# Patient Record
Sex: Male | Born: 1990 | Race: White | Hispanic: No | Marital: Married | Smoking: Current some day smoker
Health system: Southern US, Community
[De-identification: ages and names within clinical notes are randomized; demographics above are authoritative.]

## PROBLEM LIST (undated history)

## (undated) DIAGNOSIS — R748 Abnormal levels of other serum enzymes: Secondary | ICD-10-CM

## (undated) DIAGNOSIS — J45909 Unspecified asthma, uncomplicated: Secondary | ICD-10-CM

## (undated) DIAGNOSIS — I10 Essential (primary) hypertension: Secondary | ICD-10-CM

## (undated) HISTORY — DX: Hypercalcemia: E83.52

## (undated) HISTORY — DX: Unspecified asthma, uncomplicated: J45.909

## (undated) HISTORY — DX: Essential (primary) hypertension: I10

## (undated) HISTORY — DX: Abnormal levels of other serum enzymes: R74.8

---

## 2018-03-12 ENCOUNTER — Ambulatory Visit: Payer: BLUE CROSS/BLUE SHIELD | Admitting: Family Medicine

## 2018-03-12 ENCOUNTER — Encounter: Payer: Self-pay | Admitting: Family Medicine

## 2018-03-12 ENCOUNTER — Ambulatory Visit
Admission: RE | Admit: 2018-03-12 | Discharge: 2018-03-12 | Disposition: A | Discharge status: Home / Self Care | Payer: BLUE CROSS/BLUE SHIELD | Source: Ambulatory Visit | Attending: Family Medicine | Admitting: Family Medicine

## 2018-03-12 VITALS — BP 146/100 | HR 77 | Temp 98.3°F | Resp 16 | Ht 72.0 in | Wt 242.4 lb

## 2018-03-12 DIAGNOSIS — Z23 Encounter for immunization: Secondary | ICD-10-CM

## 2018-03-12 DIAGNOSIS — R030 Elevated blood-pressure reading, without diagnosis of hypertension: Secondary | ICD-10-CM | POA: Diagnosis not present

## 2018-03-12 DIAGNOSIS — R062 Wheezing: Secondary | ICD-10-CM

## 2018-03-12 DIAGNOSIS — Z7689 Persons encountering health services in other specified circumstances: Secondary | ICD-10-CM

## 2018-03-12 DIAGNOSIS — F17200 Nicotine dependence, unspecified, uncomplicated: Secondary | ICD-10-CM

## 2018-03-12 DIAGNOSIS — J4530 Mild persistent asthma, uncomplicated: Secondary | ICD-10-CM | POA: Diagnosis not present

## 2018-03-12 DIAGNOSIS — R05 Cough: Secondary | ICD-10-CM | POA: Diagnosis not present

## 2018-03-12 MED ORDER — BUDESONIDE-FORMOTEROL FUMARATE 160-4.5 MCG/ACT IN AERO: 2.0000 | INHALATION_SPRAY | Freq: Two times a day (BID) | RESPIRATORY_TRACT | 1 refills | Status: DC

## 2018-03-12 MED ORDER — ALBUTEROL SULFATE HFA 108 (90 BASE) MCG/ACT IN AERS: 2.0000 | INHALATION_SPRAY | Freq: Four times a day (QID) | RESPIRATORY_TRACT | 1 refills | Status: DC | PRN

## 2018-03-12 NOTE — Patient Instructions (Addendum)
Start using the Symbicort inhaler 2 puffs twice daily.  This is to help get her asthma under better control. Make sure you rinse your mouth after each use. Use the albuterol inhaler as needed.  I recommend that you stop smoking.  Your blood pressure is elevated today. Goal BP reading is <130/80.  Purchase a blood pressure cuff and start checking your blood pressure outside of here. Bring in your blood pressure machine and readings in 2 to 4 weeks. Your visit will be to follow up on asthma, blood pressure as well as a fasting physical exam.      DASH Eating Plan DASH stands for "Dietary Approaches to Stop Hypertension." The DASH eating plan is a healthy eating plan that has been shown to reduce high blood pressure (hypertension). It may also reduce your risk for type 2 diabetes, heart disease, and stroke. The DASH eating plan may also help with weight loss. What are tips for following this plan?  General guidelines  Avoid eating more than 2,300 mg (milligrams) of salt (sodium) a day. If you have hypertension, you may need to reduce your sodium intake to 1,500 mg a day.  Limit alcohol intake to no more than 1 drink a day for nonpregnant women and 2 drinks a day for men. One drink equals 12 oz of beer, 5 oz of wine, or 1 oz of hard liquor.  Work with your health care provider to maintain a healthy body weight or to lose weight. Ask what an ideal weight is for you.  Get at least 30 minutes of exercise that causes your heart to beat faster (aerobic exercise) most days of the week. Activities may include walking, swimming, or biking.  Work with your health care provider or diet and nutrition specialist (dietitian) to adjust your eating plan to your individual calorie needs. Reading food labels   Check food labels for the amount of sodium per serving. Choose foods with less than 5 percent of the Daily Value of sodium. Generally, foods with less than 300 mg of sodium per serving fit into  this eating plan.  To find whole grains, look for the word "whole" as the first word in the ingredient list. Shopping  Buy products labeled as "low-sodium" or "no salt added."  Buy fresh foods. Avoid canned foods and premade or frozen meals. Cooking  Avoid adding salt when cooking. Use salt-free seasonings or herbs instead of table salt or sea salt. Check with your health care provider or pharmacist before using salt substitutes.  Do not fry foods. Cook foods using healthy methods such as baking, boiling, grilling, and broiling instead.  Cook with heart-healthy oils, such as olive, canola, soybean, or sunflower oil. Meal planning  Eat a balanced diet that includes: ? 5 or more servings of fruits and vegetables each day. At each meal, try to fill half of your plate with fruits and vegetables. ? Up to 6-8 servings of whole grains each day. ? Less than 6 oz of lean meat, poultry, or fish each day. A 3-oz serving of meat is about the same size as a deck of cards. One egg equals 1 oz. ? 2 servings of low-fat dairy each day. ? A serving of nuts, seeds, or beans 5 times each week. ? Heart-healthy fats. Healthy fats called Omega-3 fatty acids are found in foods such as flaxseeds and coldwater fish, like sardines, salmon, and mackerel.  Limit how much you eat of the following: ? Canned or prepackaged foods. ? Food  that is high in trans fat, such as fried foods. ? Food that is high in saturated fat, such as fatty meat. ? Sweets, desserts, sugary drinks, and other foods with added sugar. ? Full-fat dairy products.  Do not salt foods before eating.  Try to eat at least 2 vegetarian meals each week.  Eat more home-cooked food and less restaurant, buffet, and fast food.  When eating at a restaurant, ask that your food be prepared with less salt or no salt, if possible. What foods are recommended? The items listed may not be a complete list. Talk with your dietitian about what dietary  choices are best for you. Grains Whole-grain or whole-wheat bread. Whole-grain or whole-wheat pasta. Brown rice. [place]. Bulgur. Whole-grain and low-sodium cereals. Pita bread. Low-fat, low-sodium crackers. Whole-wheat flour tortillas. Vegetables Fresh or frozen vegetables (raw, steamed, roasted, or grilled). Low-sodium or reduced-sodium tomato and vegetable juice. Low-sodium or reduced-sodium tomato sauce and tomato paste. Low-sodium or reduced-sodium canned vegetables. Fruits All fresh, dried, or frozen fruit. Canned fruit in natural juice (without added sugar). Meat and other protein foods Skinless chicken or [place]. Ground chicken or [place]. Pork with fat trimmed off. Fish and seafood. Egg whites. Dried beans, peas, or lentils. Unsalted nuts, nut butters, and seeds. Unsalted canned beans. Lean cuts of beef with fat trimmed off. Low-sodium, lean deli meat. Dairy Low-fat (1%) or fat-free (skim) milk. Fat-free, low-fat, or reduced-fat cheeses. Nonfat, low-sodium ricotta or cottage cheese. Low-fat or nonfat yogurt. Low-fat, low-sodium cheese. Fats and oils Soft margarine without trans fats. Vegetable oil. Low-fat, reduced-fat, or light mayonnaise and salad dressings (reduced-sodium). Canola, safflower, olive, soybean, and sunflower oils. Avocado. Seasoning and other foods Herbs. Spices. Seasoning mixes without salt. Unsalted popcorn and pretzels. Fat-free sweets. What foods are not recommended? The items listed may not be a complete list. Talk with your dietitian about what dietary choices are best for you. Grains Baked goods made with fat, such as croissants, muffins, or some breads. Dry pasta or rice meal packs. Vegetables Creamed or fried vegetables. Vegetables in a cheese sauce. Regular canned vegetables (not low-sodium or reduced-sodium). Regular canned tomato sauce and paste (not low-sodium or reduced-sodium). Regular tomato and vegetable juice (not low-sodium or reduced-sodium).  Angie Fava. Olives. Fruits Canned fruit in a light or heavy syrup. Fried fruit. Fruit in cream or butter sauce. Meat and other protein foods Fatty cuts of meat. Ribs. Fried meat. Berniece Salines. Sausage. Bologna and other processed lunch meats. Salami. Fatback. Hotdogs. Bratwurst. Salted nuts and seeds. Canned beans with added salt. Canned or smoked fish. Whole eggs or egg yolks. Chicken or [place] with skin. Dairy Whole or 2% milk, cream, and half-and-half. Whole or full-fat cream cheese. Whole-fat or sweetened yogurt. Full-fat cheese. Nondairy creamers. Whipped toppings. Processed cheese and cheese spreads. Fats and oils Butter. Stick margarine. Lard. Shortening. Ghee. Bacon fat. Tropical oils, such as coconut, palm kernel, or palm oil. Seasoning and other foods Salted popcorn and pretzels. Onion salt, garlic salt, seasoned salt, table salt, and sea salt. Worcestershire sauce. Tartar sauce. Barbecue sauce. Teriyaki sauce. Soy sauce, including reduced-sodium. Steak sauce. Canned and packaged gravies. Fish sauce. Oyster sauce. Cocktail sauce. Horseradish that you find on the shelf. Ketchup. Mustard. Meat flavorings and tenderizers. Bouillon cubes. Hot sauce and Tabasco sauce. Premade or packaged marinades. Premade or packaged taco seasonings. Relishes. Regular salad dressings. Where to find more information:  National Heart, Lung, and [place]: https://wilson-eaton.com/  American Heart Association: www.heart.org Summary  The DASH eating plan is a  healthy eating plan that has been shown to reduce high blood pressure (hypertension). It may also reduce your risk for type 2 diabetes, heart disease, and stroke.  With the DASH eating plan, you should limit salt (sodium) intake to 2,300 mg a day. If you have hypertension, you may need to reduce your sodium intake to 1,500 mg a day.  When on the DASH eating plan, aim to eat more fresh fruits and vegetables, whole grains, lean proteins, low-fat dairy, and  heart-healthy fats.  Work with your health care provider or diet and nutrition specialist (dietitian) to adjust your eating plan to your individual calorie needs. This information is not intended to replace advice given to you by your health care provider. Make sure you discuss any questions you have with your health care provider. Document Released: 01/06/2011 Document Revised: 01/11/2016 Document Reviewed: 01/11/2016 Elsevier Interactive Patient Education  2019 [employer].

## 2018-03-12 NOTE — Progress Notes (Signed)
Subjective:    Patient ID: Derek Powell, male    DOB: 12/24/1990, 28 y.o.   MRN: [id number]  HPI Chief Complaint  Patient presents with  . new pt    new pt, asthma- (had asthma as a kid and was on med as a kid)    He is new to the practice and here to establish care. Moved here from [facility] in October 2019  Previous medical care: no medical care in years per patient   Other providers: None   Asthma diagnosed in childhood.  Reports having multiple triggers including allergies, exercise.  Has been on Advair, Singular and Albuterol in the distant past. He had allergy testing years ago.   States he recently used wife's inhaler and noticed improvement in breathing.  States he did not even realize that he was having difficulty breathing or wheezing.  This is at baseline for him. He does reports a dry nagging cough that is persistent. Wheezes   Denies fever, chills, dizziness, chest pain, palpitations, shortness of breath, abdominal pain, N/V/D, urinary symptoms, LE edema.   He is aware that his BP is elevated today. Reports history of elevated readings at doctors offices. Denies being diagnosed with HTN in the past. Does not check BP at home.   Family history of HTN and hyperlipidemia in father and brother.   Smokes marijuana 2-3 times per week and one cigar per week.  Alcohol on the weekends.   Recently saw dentist.   Married. No kids. 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  2 cats and a dog.    Review of Systems Pertinent positives and negatives in the history of present illness.     Objective:   Physical Exam BP (!) 146/100   Pulse 77   Temp 98.3 F (36.8 C) (Oral)   Resp 16   Ht 6' (1.829 m)   Wt 242 lb 6.4 oz (110 kg)   SpO2 97%   BMI 32.88 kg/m   Alert and oriented and in no acute distress. Pharyngeal area is normal. Neck is supple without adenopathy or thyromegaly. Cardiac exam shows a regular sinus rhythm without murmurs or gallops. Lungs with expiratory  wheezing throughout, normal work of breathing.  Skin is warm and dry. No pallor. Speech is normal.       Assessment & Plan:  Mild persistent asthma without complication - Plan: Spirometry with graph, budesonide-formoterol (SYMBICORT) 160-4.5 MCG/ACT inhaler, albuterol (PROVENTIL HFA;VENTOLIN HFA) 108 (90 Base) MCG/ACT inhaler, DG Chest 2 View  Encounter to establish care  Blood pressure elevated without history of HTN  Needs flu shot - Plan: Flu Vaccine QUAD 36+ mos IM  Wheezing - Plan: DG Chest 2 View  Smoker - Plan: DG Chest 2 View  He is a pleasant 28 year old male who is here today to establish care. He has a history of asthma since childhood that has been uncontrolled for several years.  He has avoided any regular medical care but states he is motivated to start taking better care of himself. PFT is abnormal today showing moderate airway obstruction.  He is wheezing on exam yet he reports feeling at baseline.  I will send him for chest x-ray Symbicort sample given and instructions for use including rinsing his mouth out after using it. As to send this prescription to his pharmacy along with albuterol inhaler. Counseling on smoking cessation.  He is smoking marijuana and cigars.  Does not smoke cigarettes. Blood pressure is elevated and apparently he has a history of elevated  blood pressures.  He has a significant family history of hypertension.  He will buy a blood pressure cuff and start checking his blood pressure outside of here.  DASH diet information given. Return in 2 to 4 weeks for a fasting CPE as well as to follow-up on asthma and blood pressure.  He is aware that he may need to start on medication if his blood pressure continues to be elevated. Declines labs today, we will check these at his return visit for his physical.

## 2018-03-26 ENCOUNTER — Encounter: Payer: Self-pay | Admitting: Family Medicine

## 2018-03-26 ENCOUNTER — Ambulatory Visit: Payer: BLUE CROSS/BLUE SHIELD | Admitting: Family Medicine

## 2018-03-26 VITALS — BP 140/92 | HR 86 | Ht 72.0 in | Wt 248.6 lb

## 2018-03-26 DIAGNOSIS — J45909 Unspecified asthma, uncomplicated: Secondary | ICD-10-CM | POA: Insufficient documentation

## 2018-03-26 DIAGNOSIS — Z13220 Encounter for screening for lipoid disorders: Secondary | ICD-10-CM

## 2018-03-26 DIAGNOSIS — Z23 Encounter for immunization: Secondary | ICD-10-CM | POA: Diagnosis not present

## 2018-03-26 DIAGNOSIS — J4530 Mild persistent asthma, uncomplicated: Secondary | ICD-10-CM

## 2018-03-26 DIAGNOSIS — Z8349 Family history of other endocrine, nutritional and metabolic diseases: Secondary | ICD-10-CM | POA: Insufficient documentation

## 2018-03-26 DIAGNOSIS — Z0000 Encounter for general adult medical examination without abnormal findings: Secondary | ICD-10-CM | POA: Diagnosis not present

## 2018-03-26 DIAGNOSIS — Z114 Encounter for screening for human immunodeficiency virus [HIV]: Secondary | ICD-10-CM | POA: Diagnosis not present

## 2018-03-26 DIAGNOSIS — I10 Essential (primary) hypertension: Secondary | ICD-10-CM | POA: Diagnosis not present

## 2018-03-26 DIAGNOSIS — E669 Obesity, unspecified: Secondary | ICD-10-CM | POA: Insufficient documentation

## 2018-03-26 LAB — POCT URINALYSIS DIP (PROADVANTAGE DEVICE)
Bilirubin, UA: NEGATIVE
Blood, UA: NEGATIVE
GLUCOSE UA: NEGATIVE mg/dL
Ketones, POC UA: NEGATIVE mg/dL
Leukocytes, UA: NEGATIVE
Nitrite, UA: NEGATIVE
Specific Gravity, Urine: 1.03
Urobilinogen, Ur: NEGATIVE
pH, UA: 6 (ref 5.0–8.0)

## 2018-03-26 MED ORDER — LOSARTAN POTASSIUM 50 MG PO TABS: 50.0000 mg | ORAL_TABLET | Freq: Every day | ORAL | 2 refills | Status: DC

## 2018-03-26 NOTE — Progress Notes (Signed)
Subjective:    Patient ID: Derek Powell, male    DOB: November 05, 1990, 28 y.o.   MRN: [id number]  HPI Chief Complaint  Patient presents with  . cpe    fasting cpe , follow-up on HTn- running in the 140s/90s. asthma- doing better with inhaler   He is fairly new to the practice and here for a complete physical exam. Moved here from [facility] in October 2019  Previous medical care: no regular medical care. No PCP  Last CPE: years ago  Other providers: None   Asthma diagnosed in childhood.  Reports having multiple triggers including allergies, exercise. Had been on Advair, Singular and Albuterol in the distant past. He had allergy testing years ago.  PFT on 03/12/18 showed moderate obstruction.  Chest XR was negative.   We started him on Symbicort.  Using albuterol   His BP was significantly elevated at his initial visit and he has been checking his BP at home.  Denies history of HTN diagnosis but BP is usually elevated when checked at doctor office per patient.   Family history of HTN and hyperlipidemia in father and brother.   Smokes marijuana 2-3 times per week and one cigar per week.  Alcohol on the weekends.   Married. No kids. 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  2 cats and a dog.  Diet: fairly healthy. Keto mainly. High fat.  Exercise: twice per week.   Immunizations: flu vaccine earlier this month. Tdap 2005 HPV-declines  Reports being in a monogamous relationship.  No new sexual partner for 7 years.  Denies ever being tested for HIV.  Gets up every morning at 6 am to urinate. No urinary frequency.   Taking melatonin and CBD oil.   Health maintenance:  Colonoscopy: N/A Last PSA: N/A Last Dental Exam: in the past month Last Eye Exam: years ago   Wears seatbelt always, uses sunscreen, smoke detectors in home and functioning, does not text while driving, feels safe in home environment.  Reviewed allergies, medications, past medical, surgical,  family, and social history.     Review of Systems Review of Systems Constitutional: -fever, -chills, -sweats, -unexpected weight change,-fatigue ENT: -runny nose, -ear pain, -sore throat Cardiology:  -chest pain, -palpitations, -edema Respiratory: -cough, -shortness of breath, -wheezing Gastroenterology: -abdominal pain, -nausea, -vomiting, -diarrhea, -constipation  Hematology: -bleeding or bruising problems Musculoskeletal: -arthralgias, -myalgias, -joint swelling, -back pain Ophthalmology: -vision changes Urology: -dysuria, -difficulty urinating, -hematuria, -urinary frequency, -urgency Neurology: -headache, -weakness, -tingling, -numbness       Objective:   Physical Exam BP (!) 140/92   Pulse 86   Ht 6' (1.829 m)   Wt 248 lb 9.6 oz (112.8 kg)   BMI 33.72 kg/m   General Appearance:    Alert, cooperative, no distress, appears stated age  Head:    Normocephalic, without obvious abnormality, atraumatic  Eyes:    PERRL, conjunctiva/corneas clear, EOM's intact, fundi    benign  Ears:    Normal TM's and external ear canals  Nose:   Nares normal, mucosa normal, no drainage or sinus   tenderness  Throat:   Lips, mucosa, and tongue normal; teeth and gums normal  Neck:   Supple, no lymphadenopathy;  thyroid:  no   enlargement/tenderness/nodules; no carotid   bruit or JVD  Back:    Spine nontender, no curvature, ROM normal, no CVA     tenderness  Lungs:     Clear to auscultation bilaterally without wheezes, rales or     ronchi;  respirations unlabored  Chest Wall:    No tenderness or deformity   Heart:    Regular rate and rhythm, S1 and S2 normal, no murmur, rub   or gallop  Breast Exam:    No chest wall tenderness, masses or gynecomastia  Abdomen:     Soft, non-tender, nondistended, normoactive bowel sounds,    no masses, no hepatosplenomegaly  Genitalia:    Normal male external genitalia without lesions.  Testicles without masses.  Left testicle slightly greater in size compared  to right.  No inguinal hernias.  Rectal:   Deferred due to age <40 and lack of symptoms  Extremities:   No clubbing, cyanosis or edema  Pulses:   2+ and symmetric all extremities  Skin:   Skin color, texture, turgor normal, no rashes or lesions  Lymph nodes:   Cervical, supraclavicular, and axillary nodes normal  Neurologic:   CNII-XII intact, normal strength, sensation and gait; reflexes 2+ and symmetric throughout          Psych:   Normal mood, affect, hygiene and grooming.     Urinalysis dipstick: trace protein       Assessment & Plan:  Routine general medical examination at a health care facility - Plan: POCT Urinalysis DIP (Proadvantage Device), CBC with Differential/Platelet, Comprehensive metabolic panel, TSH, T4, free, Lipid panel  Essential hypertension - Plan: losartan (COZAAR) 50 MG tablet, CBC with Differential/Platelet, Comprehensive metabolic panel  Mild persistent asthma without complication  Family history of thyroid disease in mother - Plan: TSH, T4, free  Screening for lipid disorders - Plan: Lipid panel  Obesity (BMI 30.0-34.9) - Plan: TSH, T4, free, Lipid panel, Hemoglobin A1c  Screening for HIV without presence of risk factors - Plan: HIV Antibody (routine testing w rflx)  Need for Tdap vaccination - Plan: Tdap vaccine greater than or equal to 7yo IM  Here today to follow-up on elevated blood pressure and persistent asthma as well as a fasting CPE. Reports using Symbicort twice daily and has noticed significant improvement in breathing.  No longer coughing.  States he has not needed the albuterol inhaler.  Continue on Symbicort and use albuterol as needed.  He will let me know if he is needing albuterol more than twice weekly. Blood pressure is elevated once again today.  He did bring in his 2-week history of blood pressure recordings at home.  He brought in his blood pressure machine and it is accurate compared to the office blood pressure cuff.  His readings  have been consistently in the 130s-150s/80s and 90s.  He recalls a long history of elevated blood pressures but is never been on medication.  Family history of hypertension.  He is in agreement to start on medication today. Reports cutting back on salt.  He is not exercising regularly.  Diet is high in fat.  Counseling was done on healthy diet and exercise. Start on losartan, continue checking blood pressure at home and follow-up in 4 weeks. Immunization counseling was done.  Tdap updated.  Flu shot is up-to-date.  Discussed HPV vaccine and he declines this. HIV screen was done.  Denies ever having this checked but he is very low risk. Discussed safety and health maintenance.  Advised importance of testicular exams. Follow-up pending labs or in 4 weeks for hypertension and new medication.

## 2018-03-26 NOTE — Patient Instructions (Addendum)
Start the losartan once daily as prescribed. Continue to check your BP at home. Goal is <130/80.   Follow up on blood pressures in 4 weeks.   Continue with twice daily Symbicort. Let me know if you are needing your albuterol inhaler more than twice per week.   We will call you regarding your results.    Here is a list of Eye Doctors that you call and schedule an appointment with.   [facility] Address: [place], [place], [place] [address] Phone: [phone]   [facility] Address: [address], [place], [place] [address] Phone: [phone]  Dr. Katy Fitch Address: [address] [place], [place], [place] [address] Phone: [phone]   You can call and schedule your Dentist appointment at any of the following offices:   [facility] Address: [address]  [place], [place] [address] Phone #: [phone]  [facility] Address: [address] [place], [place] [address] Phone # [phone]  [facility]  Address: [address]                                             [place],  [address] Phone #: [phone]    Preventive Care 18-39 Years, Male Preventive care refers to lifestyle choices and visits with your health care provider that can promote health and wellness. What does preventive care include?   A yearly physical exam. This is also called an annual well check.  Dental exams once or twice a year.  Routine eye exams. Ask your health care provider how often you should have your eyes checked.  Personal lifestyle choices, including: ? Daily care of your teeth and gums. ? Regular physical activity. ? Eating a healthy diet. ? Avoiding tobacco and drug use. ? Limiting alcohol use. ? Practicing safe sex. What happens during an annual well check? The services and screenings done by your health care provider during your annual well check will depend  on your age, overall health, lifestyle risk factors, and family history of disease. Counseling Your health care provider may ask you questions about your:  Alcohol use.  Tobacco use.  Drug use.  Emotional well-being.  Home and relationship well-being.  Sexual activity.  Eating habits.  Work and work Statistician. Screening You may have the following tests or measurements:  Height, weight, and BMI.  Blood pressure.  Lipid and cholesterol levels. These may be checked every 5 years starting at age 61.  Diabetes screening. This is done by checking your blood sugar (glucose) after you have not eaten for a while (fasting).  Skin check.  Hepatitis C blood test.  Hepatitis B blood test.  Sexually transmitted disease (STD) testing. Discuss your test results, treatment options, and if necessary, the need for more tests with your health care provider. Vaccines Your health care provider may recommend certain vaccines, such as:  Influenza vaccine. This is recommended every year.  Tetanus, diphtheria, and acellular pertussis (Tdap, Td) vaccine. You may need a Td booster every 10 years.  Varicella vaccine. You may need this if you have not been vaccinated.  HPV vaccine. If you are 76 or younger, you may need three doses over 6 months.  Measles, mumps, and rubella (MMR) vaccine. You may need at least one dose of MMR.You may also  need a second dose.  Pneumococcal 13-valent conjugate (PCV13) vaccine. You may need this if you have certain conditions and have not been vaccinated.  Pneumococcal polysaccharide (PPSV23) vaccine. You may need one or two doses if you smoke cigarettes or if you have certain conditions.  Meningococcal vaccine. One dose is recommended if you are age 82-21 years and a first-year college student living in a residence hall, or if you have one of several medical conditions. You may also need additional booster doses.  Hepatitis A vaccine. You may need this  if you have certain conditions or if you travel or work in places where you may be exposed to hepatitis A.  Hepatitis B vaccine. You may need this if you have certain conditions or if you travel or work in places where you may be exposed to hepatitis B.  Haemophilus influenzae type b (Hib) vaccine. You may need this if you have certain risk factors. Talk to your health care provider about which screenings and vaccines you need and how often you need them. This information is not intended to replace advice given to you by your health care provider. Make sure you discuss any questions you have with your health care provider. Document Released: 03/15/2001 Document Revised: 08/30/2016 Document Reviewed: 11/18/2014 Elsevier Interactive Patient Education  2019 [employer].

## 2018-03-27 LAB — COMPREHENSIVE METABOLIC PANEL
ALBUMIN: 4.7 g/dL (ref 4.1–5.2)
ALT: 98 IU/L — ABNORMAL HIGH (ref 0–44)
AST: 52 IU/L — ABNORMAL HIGH (ref 0–40)
Albumin/Globulin Ratio: 1.9 (ref 1.2–2.2)
Alkaline Phosphatase: 46 IU/L (ref 39–117)
BUN/Creatinine Ratio: 13 (ref 9–20)
BUN: 12 mg/dL (ref 6–20)
Bilirubin Total: 0.2 mg/dL (ref 0.0–1.2)
CO2: 22 mmol/L (ref 20–29)
CREATININE: 0.91 mg/dL (ref 0.76–1.27)
Calcium: 10.3 mg/dL — ABNORMAL HIGH (ref 8.7–10.2)
Chloride: 100 mmol/L (ref 96–106)
GFR calc Af Amer: 131 mL/min/{1.73_m2} (ref >59)
GFR calc non Af Amer: 113 mL/min/{1.73_m2} (ref >59)
Globulin, Total: 2.5 g/dL (ref 1.5–4.5)
Glucose: 109 mg/dL — ABNORMAL HIGH (ref 65–99)
Potassium: 4.7 mmol/L (ref 3.5–5.2)
Sodium: 140 mmol/L (ref 134–144)
Total Protein: 7.2 g/dL (ref 6.0–8.5)

## 2018-03-27 LAB — LIPID PANEL
Chol/HDL Ratio: 5.2 ratio — ABNORMAL HIGH (ref 0.0–5.0)
Cholesterol, Total: 260 mg/dL — ABNORMAL HIGH (ref 100–199)
HDL: 50 mg/dL (ref >39)
LDL Calculated: 175 mg/dL — ABNORMAL HIGH (ref 0–99)
Triglycerides: 173 mg/dL — ABNORMAL HIGH (ref 0–149)
VLDL CHOLESTEROL CAL: 35 mg/dL (ref 5–40)

## 2018-03-27 LAB — CBC WITH DIFFERENTIAL/PLATELET
Basophils Absolute: 0 10*3/uL (ref 0.0–0.2)
Basos: 1 %
EOS (ABSOLUTE): 0.3 10*3/uL (ref 0.0–0.4)
Eos: 6 %
Hematocrit: 46.8 % (ref 37.5–51.0)
Hemoglobin: 15.9 g/dL (ref 13.0–17.7)
Immature Grans (Abs): 0 10*3/uL (ref 0.0–0.1)
Immature Granulocytes: 1 %
LYMPHS: 30 %
Lymphocytes Absolute: 1.6 10*3/uL (ref 0.7–3.1)
MCH: 30.5 pg (ref 26.6–33.0)
MCHC: 34 g/dL (ref 31.5–35.7)
MCV: 90 fL (ref 79–97)
Monocytes Absolute: 0.6 10*3/uL (ref 0.1–0.9)
Monocytes: 12 %
Neutrophils Absolute: 2.8 10*3/uL (ref 1.4–7.0)
Neutrophils: 50 %
Platelets: 239 10*3/uL (ref 150–450)
RBC: 5.22 x10E6/uL (ref 4.14–5.80)
RDW: 13 % (ref 11.6–15.4)
WBC: 5.4 10*3/uL (ref 3.4–10.8)

## 2018-03-27 LAB — TSH: TSH: 3.91 u[IU]/mL (ref 0.450–4.500)

## 2018-03-27 LAB — T4, FREE: Free T4: 1.1 ng/dL (ref 0.82–1.77)

## 2018-03-27 LAB — HIV ANTIBODY (ROUTINE TESTING W REFLEX): HIV Screen 4th Generation wRfx: NONREACTIVE

## 2018-03-27 LAB — HEMOGLOBIN A1C
Est. average glucose Bld gHb Est-mCnc: 114 mg/dL
HEMOGLOBIN A1C: 5.6 % (ref 4.8–5.6)

## 2018-03-29 LAB — SPECIMEN STATUS REPORT

## 2018-03-29 LAB — HEPATITIS PANEL, ACUTE
HEP B C IGM: NEGATIVE
Hep A IgM: NEGATIVE
Hep C Virus Ab: <0.1 s/co ratio (ref 0.0–0.9)
Hepatitis B Surface Ag: NEGATIVE

## 2018-04-02 ENCOUNTER — Ambulatory Visit: Payer: BLUE CROSS/BLUE SHIELD | Admitting: Family Medicine

## 2018-04-24 ENCOUNTER — Ambulatory Visit: Payer: BLUE CROSS/BLUE SHIELD | Admitting: Family Medicine

## 2018-04-24 ENCOUNTER — Encounter: Payer: Self-pay | Admitting: Family Medicine

## 2018-04-24 VITALS — BP 140/88 | HR 97 | Wt 247.2 lb

## 2018-04-24 DIAGNOSIS — R748 Abnormal levels of other serum enzymes: Secondary | ICD-10-CM | POA: Diagnosis not present

## 2018-04-24 DIAGNOSIS — E669 Obesity, unspecified: Secondary | ICD-10-CM | POA: Diagnosis not present

## 2018-04-24 DIAGNOSIS — I10 Essential (primary) hypertension: Secondary | ICD-10-CM

## 2018-04-24 DIAGNOSIS — E7800 Pure hypercholesterolemia, unspecified: Secondary | ICD-10-CM

## 2018-04-24 DIAGNOSIS — J4530 Mild persistent asthma, uncomplicated: Secondary | ICD-10-CM

## 2018-04-24 DIAGNOSIS — F439 Reaction to severe stress, unspecified: Secondary | ICD-10-CM

## 2018-04-24 DIAGNOSIS — E66811 Obesity, class 1: Secondary | ICD-10-CM

## 2018-04-24 LAB — COMPREHENSIVE METABOLIC PANEL
ALT: 91 IU/L — ABNORMAL HIGH (ref 0–44)
AST: 40 IU/L (ref 0–40)
Albumin/Globulin Ratio: 2 (ref 1.2–2.2)
Albumin: 4.9 g/dL (ref 4.1–5.2)
Alkaline Phosphatase: 51 IU/L (ref 39–117)
BUN/Creatinine Ratio: 14 (ref 9–20)
BUN: 13 mg/dL (ref 6–20)
Bilirubin Total: 0.4 mg/dL (ref 0.0–1.2)
CO2: 25 mmol/L (ref 20–29)
Calcium: 10.8 mg/dL — ABNORMAL HIGH (ref 8.7–10.2)
Chloride: 98 mmol/L (ref 96–106)
Creatinine, Ser: 0.96 mg/dL (ref 0.76–1.27)
GFR calc non Af Amer: 106 mL/min/{1.73_m2} (ref >59)
GFR, EST AFRICAN AMERICAN: 123 mL/min/{1.73_m2} (ref >59)
Globulin, Total: 2.4 g/dL (ref 1.5–4.5)
Glucose: 107 mg/dL — ABNORMAL HIGH (ref 65–99)
Potassium: 5 mmol/L (ref 3.5–5.2)
Sodium: 139 mmol/L (ref 134–144)
Total Protein: 7.3 g/dL (ref 6.0–8.5)

## 2018-04-24 MED ORDER — LOSARTAN POTASSIUM 100 MG PO TABS: 100.0000 mg | ORAL_TABLET | Freq: Every day | ORAL | 1 refills | Status: DC

## 2018-04-24 MED ORDER — BUDESONIDE-FORMOTEROL FUMARATE 160-4.5 MCG/ACT IN AERO: 2.0000 | INHALATION_SPRAY | Freq: Two times a day (BID) | RESPIRATORY_TRACT | 1 refills | Status: DC

## 2018-04-24 NOTE — Patient Instructions (Addendum)
I am increasing your blood pressure medication.  Please call let me know if you have any issues getting this filled at your pharmacy. Continue checking your blood pressure daily and report back in 4 weeks.  This can be a telephone visit.  At some point in the next 4 to 6 weeks I would like for you to return for fasting labs to recheck your cholesterol.  Try to cut back on alcohol and smoking.   You can call to schedule your appointment with the counselor. A few offices are listed below for you to call.        [facility]  [address], [address], [place], [place] [address]  Phone: [phone]   [facility] [address] [place], [place] [address]  Phone: [phone]    Darryl A. Hyers, RN, PhD, [facility]  [address] [place], [place] [address] Phone: [phone]  [facility]  [address], [place], [place] [address] Phone: [phone]  [facility]  [address]  (across from [facility])  [phone]   [facility] [address] Sonny Masters [place], [place] [address] [phone]

## 2018-04-24 NOTE — Progress Notes (Signed)
   Subjective:    Patient ID: Derek Powell, male    DOB: 10-Feb-1990, 28 y.o.   MRN: [id number]  HPI Chief Complaint  Patient presents with  . 4 week follow-up    4 week follow-up on bp. running around 140-150/ 90s   He is here for a 4 week follow up on HTN and follow-up on abnormal labs. Elevated LFTs.  Reports he is still drinking alcohol and actually drinking more often now that he is at home with his wife and because of stress with the pandemic. States he drinks 4 to 5 days/week and heavily on the weekends. States he has cut back on smoking.  Asthma is controlled with Symbicort.  States he will need a refill soon.  Elevated LDL- 175.  Reports family history of hyperlipidemia and hypertension.  States his diet has been poor.  He drinks over half a pot of coffee daily.  States he is under a lot of stress right now and would like help finding a Veterinary surgeon.  Denies thoughts of self-harm.  States he is more irritable.  Denies fever, chills, dizziness, chest pain, palpitations, shortness of breath, abdominal pain, N/V/D, urinary symptoms, LE edema.     Review of Systems Pertinent positives and negatives in the history of present illness.     Objective:   Physical Exam BP 140/88   Pulse 97   Wt 247 lb 3.2 oz (112.1 kg)   BMI 33.53 kg/m   Alert and oriented and in no acute distress.  Not otherwise examined.      Assessment & Plan:  Essential hypertension - Plan: losartan (COZAAR) 100 MG tablet  Elevated liver enzymes - Plan: Comprehensive metabolic panel  Elevated LDL cholesterol level - Plan: Lipid panel  Obesity (BMI 30.0-34.9)  Situational stress  Mild persistent asthma without complication - Plan: budesonide-formoterol (SYMBICORT) 160-4.5 MCG/ACT inhaler  Blood pressure readings over the past month at home have been mostly elevated.  His blood pressure today is elevated as well.  Reports taking losartan 25 mg twice daily because the pharmacy did not have 50  mg tablets.  We will increase his losartan dose.  Counseling on healthy diet to help control hypertension and cholesterol. Recheck liver function test.  Strongly encouraged him to cut back on alcohol. Requests refill of Symbicort.  Asthma appears to be well controlled. List of counselors was given to him.  He is not interested in medication for stress and anxiety.  Discussed ways to help calm him.  Encouraged him to increase his physical activity around the house. He is aware that if his liver enzymes continue to be elevated that I will recommend a right upper quadrant ultrasound.  He will also return or call in 4 weeks with his blood pressure readings. Return for a lab visit for repeat fasting lipids in 4 to 6 weeks.

## 2018-04-25 ENCOUNTER — Encounter: Payer: Self-pay | Admitting: Family Medicine

## 2018-04-25 ENCOUNTER — Other Ambulatory Visit: Payer: Self-pay | Admitting: Family Medicine

## 2018-04-25 DIAGNOSIS — R748 Abnormal levels of other serum enzymes: Secondary | ICD-10-CM

## 2018-04-25 HISTORY — DX: Hypercalcemia: E83.52

## 2018-05-01 ENCOUNTER — Other Ambulatory Visit: Payer: BLUE CROSS/BLUE SHIELD

## 2018-05-04 ENCOUNTER — Other Ambulatory Visit: Payer: BLUE CROSS/BLUE SHIELD

## 2018-05-04 ENCOUNTER — Other Ambulatory Visit: Payer: Self-pay

## 2018-05-04 DIAGNOSIS — E7800 Pure hypercholesterolemia, unspecified: Secondary | ICD-10-CM | POA: Diagnosis not present

## 2018-05-05 LAB — PTH, INTACT AND CALCIUM
Calcium: 10.8 mg/dL — ABNORMAL HIGH (ref 8.7–10.2)
PTH: 35 pg/mL (ref 15–65)

## 2018-05-05 LAB — LIPID PANEL
Chol/HDL Ratio: 6.1 ratio — ABNORMAL HIGH (ref 0.0–5.0)
Cholesterol, Total: 261 mg/dL — ABNORMAL HIGH (ref 100–199)
HDL: 43 mg/dL (ref >39)
LDL Calculated: 182 mg/dL — ABNORMAL HIGH (ref 0–99)
Triglycerides: 180 mg/dL — ABNORMAL HIGH (ref 0–149)
VLDL Cholesterol Cal: 36 mg/dL (ref 5–40)

## 2018-05-05 LAB — VITAMIN D 25 HYDROXY (VIT D DEFICIENCY, FRACTURES): Vit D, 25-Hydroxy: 11.9 ng/mL — ABNORMAL LOW (ref 30.0–100.0)

## 2018-05-08 ENCOUNTER — Other Ambulatory Visit: Payer: Self-pay | Admitting: Family Medicine

## 2018-05-08 DIAGNOSIS — E559 Vitamin D deficiency, unspecified: Secondary | ICD-10-CM

## 2018-05-08 MED ORDER — VITAMIN D (ERGOCALCIFEROL) 1.25 MG (50000 UNIT) PO CAPS: 50000.0000 [IU] | ORAL_CAPSULE | ORAL | 0 refills | Status: DC

## 2018-05-14 ENCOUNTER — Encounter: Payer: Self-pay | Admitting: Internal Medicine

## 2018-05-21 ENCOUNTER — Other Ambulatory Visit: Payer: Self-pay | Admitting: Internal Medicine

## 2018-05-21 DIAGNOSIS — I10 Essential (primary) hypertension: Secondary | ICD-10-CM

## 2018-05-21 DIAGNOSIS — E559 Vitamin D deficiency, unspecified: Secondary | ICD-10-CM

## 2018-05-21 DIAGNOSIS — J4530 Mild persistent asthma, uncomplicated: Secondary | ICD-10-CM

## 2018-05-21 MED ORDER — BUDESONIDE-FORMOTEROL FUMARATE 160-4.5 MCG/ACT IN AERO: 2.0000 | INHALATION_SPRAY | Freq: Two times a day (BID) | RESPIRATORY_TRACT | 0 refills | Status: DC

## 2018-05-21 MED ORDER — LOSARTAN POTASSIUM 100 MG PO TABS: 100.0000 mg | ORAL_TABLET | Freq: Every day | ORAL | 0 refills | Status: DC

## 2018-05-21 MED ORDER — ALBUTEROL SULFATE HFA 108 (90 BASE) MCG/ACT IN AERS: 2.0000 | INHALATION_SPRAY | Freq: Four times a day (QID) | RESPIRATORY_TRACT | 0 refills | Status: DC | PRN

## 2018-05-21 NOTE — Telephone Encounter (Signed)
Pt called and states that he just got laid off at his job and his insurance will be ending in the next couple days and he needs a refill on his losartan, symbicort (using twice a day) and albuterol( takes as needed, or before he works out) to [facility] [employer].   Pt has been taking his bp and it has been running around 140/90 but he is been going through some issues with losing job and having to go on wife insurance. He has yet to pick up vitamin D meds as I just went over labs with him. He will pick that up as well. He will follow-up after he gets put on his wife's insurance.

## 2018-05-24 ENCOUNTER — Other Ambulatory Visit: Payer: BLUE CROSS/BLUE SHIELD

## 2018-10-11 ENCOUNTER — Telehealth: Payer: Self-pay | Admitting: Internal Medicine

## 2018-10-11 DIAGNOSIS — J4530 Mild persistent asthma, uncomplicated: Secondary | ICD-10-CM

## 2018-10-11 DIAGNOSIS — I10 Essential (primary) hypertension: Secondary | ICD-10-CM

## 2018-10-11 MED ORDER — ALBUTEROL SULFATE HFA 108 (90 BASE) MCG/ACT IN AERS: 2.0000 | INHALATION_SPRAY | Freq: Four times a day (QID) | RESPIRATORY_TRACT | 0 refills | Status: DC | PRN

## 2018-10-11 MED ORDER — LOSARTAN POTASSIUM 100 MG PO TABS: 100.0000 mg | ORAL_TABLET | Freq: Every day | ORAL | 0 refills | Status: DC

## 2018-10-11 MED ORDER — BUDESONIDE-FORMOTEROL FUMARATE 160-4.5 MCG/ACT IN AERO: 2.0000 | INHALATION_SPRAY | Freq: Two times a day (BID) | RESPIRATORY_TRACT | 0 refills | Status: DC

## 2018-10-11 NOTE — Telephone Encounter (Signed)
Pt would like a refill on his albuterol inhaler, symbicort inhaler and bp med. Pt uses albuterol a couple times a week before he works out, he uses symbicort inhaler everyday day, and bp has not been checked in a long time. Pt was called about his U/S but he wants to hold off on getting this done. Is this okay to refill all 3 meds

## 2018-10-11 NOTE — Telephone Encounter (Signed)
Pt was notified and scheduled for 10/7

## 2018-10-11 NOTE — Telephone Encounter (Signed)
Ok to refill medications for 30 days. Let's have him come in for a visit and make sure BP is controlled.

## 2018-11-07 ENCOUNTER — Other Ambulatory Visit: Payer: Self-pay

## 2018-11-07 ENCOUNTER — Encounter: Payer: Self-pay | Admitting: Family Medicine

## 2018-11-07 ENCOUNTER — Ambulatory Visit: Payer: Managed Care, Other (non HMO) | Admitting: Family Medicine

## 2018-11-07 VITALS — BP 140/86 | HR 70 | Temp 97.8°F | Wt 256.0 lb

## 2018-11-07 DIAGNOSIS — R748 Abnormal levels of other serum enzymes: Secondary | ICD-10-CM | POA: Diagnosis not present

## 2018-11-07 DIAGNOSIS — E7800 Pure hypercholesterolemia, unspecified: Secondary | ICD-10-CM

## 2018-11-07 DIAGNOSIS — E559 Vitamin D deficiency, unspecified: Secondary | ICD-10-CM

## 2018-11-07 DIAGNOSIS — Z23 Encounter for immunization: Secondary | ICD-10-CM

## 2018-11-07 DIAGNOSIS — F439 Reaction to severe stress, unspecified: Secondary | ICD-10-CM

## 2018-11-07 DIAGNOSIS — I10 Essential (primary) hypertension: Secondary | ICD-10-CM | POA: Diagnosis not present

## 2018-11-07 DIAGNOSIS — E669 Obesity, unspecified: Secondary | ICD-10-CM

## 2018-11-07 DIAGNOSIS — J4530 Mild persistent asthma, uncomplicated: Secondary | ICD-10-CM

## 2018-11-07 DIAGNOSIS — E66811 Obesity, class 1: Secondary | ICD-10-CM

## 2018-11-07 MED ORDER — BUDESONIDE-FORMOTEROL FUMARATE 160-4.5 MCG/ACT IN AERO: 2.0000 | INHALATION_SPRAY | Freq: Two times a day (BID) | RESPIRATORY_TRACT | 0 refills | Status: DC

## 2018-11-07 MED ORDER — VITAMIN D (ERGOCALCIFEROL) 1.25 MG (50000 UNIT) PO CAPS: 50000.0000 [IU] | ORAL_CAPSULE | ORAL | 0 refills | Status: DC

## 2018-11-07 MED ORDER — AMLODIPINE BESYLATE 5 MG PO TABS: 5.0000 mg | ORAL_TABLET | Freq: Every day | ORAL | 2 refills | Status: DC

## 2018-11-07 MED ORDER — LOSARTAN POTASSIUM 100 MG PO TABS: 100.0000 mg | ORAL_TABLET | Freq: Every day | ORAL | 2 refills | Status: DC
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
## 2018-11-07 NOTE — Patient Instructions (Signed)
I am adding a second BP medication called amlodipine to your regimen. Continue taking losartan.  Keep a close eye on your BP at home. Goal reading is less than 130/80.   I strongly recommend that you return for labs or find out if [facility] or someplace else would be more affordable based on your insurance.   Follow up with me in 2-3 months.     DASH Eating Plan DASH stands for "Dietary Approaches to Stop Hypertension." The DASH eating plan is a healthy eating plan that has been shown to reduce high blood pressure (hypertension). It may also reduce your risk for type 2 diabetes, heart disease, and stroke. The DASH eating plan may also help with weight loss. What are tips for following this plan?  General guidelines  Avoid eating more than 2,300 mg (milligrams) of salt (sodium) a day. If you have hypertension, you may need to reduce your sodium intake to 1,500 mg a day.  Limit alcohol intake to no more than 1 drink a day for nonpregnant women and 2 drinks a day for men. One drink equals 12 oz of beer, 5 oz of wine, or 1 oz of hard liquor.  Work with your health care provider to maintain a healthy body weight or to lose weight. Ask what an ideal weight is for you.  Get at least 30 minutes of exercise that causes your heart to beat faster (aerobic exercise) most days of the week. Activities may include walking, swimming, or biking.  Work with your health care provider or diet and nutrition specialist (dietitian) to adjust your eating plan to your individual calorie needs. Reading food labels   Check food labels for the amount of sodium per serving. Choose foods with less than 5 percent of the Daily Value of sodium. Generally, foods with less than 300 mg of sodium per serving fit into this eating plan.  To find whole grains, look for the word "whole" as the first word in the ingredient list. Shopping  Buy products labeled as "low-sodium" or "no salt added."  Buy fresh foods. Avoid canned  foods and premade or frozen meals. Cooking  Avoid adding salt when cooking. Use salt-free seasonings or herbs instead of table salt or sea salt. Check with your health care provider or pharmacist before using salt substitutes.  Do not fry foods. Cook foods using healthy methods such as baking, boiling, grilling, and broiling instead.  Cook with heart-healthy oils, such as olive, canola, soybean, or sunflower oil. Meal planning  Eat a balanced diet that includes: ? 5 or more servings of fruits and vegetables each day. At each meal, try to fill half of your plate with fruits and vegetables. ? Up to 6-8 servings of whole grains each day. ? Less than 6 oz of lean meat, poultry, or fish each day. A 3-oz serving of meat is about the same size as a deck of cards. One egg equals 1 oz. ? 2 servings of low-fat dairy each day. ? A serving of nuts, seeds, or beans 5 times each week. ? Heart-healthy fats. Healthy fats called Omega-3 fatty acids are found in foods such as flaxseeds and coldwater fish, like sardines, salmon, and mackerel.  Limit how much you eat of the following: ? Canned or prepackaged foods. ? Food that is high in trans fat, such as fried foods. ? Food that is high in saturated fat, such as fatty meat. ? Sweets, desserts, sugary drinks, and other foods with added sugar. ? Full-fat  dairy products.  Do not salt foods before eating.  Try to eat at least 2 vegetarian meals each week.  Eat more home-cooked food and less restaurant, buffet, and fast food.  When eating at a restaurant, ask that your food be prepared with less salt or no salt, if possible. What foods are recommended? The items listed may not be a complete list. Talk with your dietitian about what dietary choices are best for you. Grains Whole-grain or whole-wheat bread. Whole-grain or whole-wheat pasta. Brown rice. [place]ena Morrow. Bulgur. Whole-grain and low-sodium cereals. Pita bread. Low-fat, low-sodium crackers.  Whole-wheat flour tortillas. Vegetables Fresh or frozen vegetables (raw, steamed, roasted, or grilled). Low-sodium or reduced-sodium tomato and vegetable juice. Low-sodium or reduced-sodium tomato sauce and tomato paste. Low-sodium or reduced-sodium canned vegetables. Fruits All fresh, dried, or frozen fruit. Canned fruit in natural juice (without added sugar). Meat and other protein foods Skinless chicken or [place]. Ground chicken or [place]. Pork with fat trimmed off. Fish and seafood. Egg whites. Dried beans, peas, or lentils. Unsalted nuts, nut butters, and seeds. Unsalted canned beans. Lean cuts of beef with fat trimmed off. Low-sodium, lean deli meat. Dairy Low-fat (1%) or fat-free (skim) milk. Fat-free, low-fat, or reduced-fat cheeses. Nonfat, low-sodium ricotta or cottage cheese. Low-fat or nonfat yogurt. Low-fat, low-sodium cheese. Fats and oils Soft margarine without trans fats. Vegetable oil. Low-fat, reduced-fat, or light mayonnaise and salad dressings (reduced-sodium). Canola, safflower, olive, soybean, and sunflower oils. Avocado. Seasoning and other foods Herbs. Spices. Seasoning mixes without salt. Unsalted popcorn and pretzels. Fat-free sweets. What foods are not recommended? The items listed may not be a complete list. Talk with your dietitian about what dietary choices are best for you. Grains Baked goods made with fat, such as croissants, muffins, or some breads. Dry pasta or rice meal packs. Vegetables Creamed or fried vegetables. Vegetables in a cheese sauce. Regular canned vegetables (not low-sodium or reduced-sodium). Regular canned tomato sauce and paste (not low-sodium or reduced-sodium). Regular tomato and vegetable juice (not low-sodium or reduced-sodium). Angie Fava. Olives. Fruits Canned fruit in a light or heavy syrup. Fried fruit. Fruit in cream or butter sauce. Meat and other protein foods Fatty cuts of meat. Ribs. Fried meat. Berniece Salines. Sausage. Bologna and other  processed lunch meats. Salami. Fatback. Hotdogs. Bratwurst. Salted nuts and seeds. Canned beans with added salt. Canned or smoked fish. Whole eggs or egg yolks. Chicken or [place] with skin. Dairy Whole or 2% milk, cream, and half-and-half. Whole or full-fat cream cheese. Whole-fat or sweetened yogurt. Full-fat cheese. Nondairy creamers. Whipped toppings. Processed cheese and cheese spreads. Fats and oils Butter. Stick margarine. Lard. Shortening. Ghee. Bacon fat. Tropical oils, such as coconut, palm kernel, or palm oil. Seasoning and other foods Salted popcorn and pretzels. Onion salt, garlic salt, seasoned salt, table salt, and sea salt. Worcestershire sauce. Tartar sauce. Barbecue sauce. Teriyaki sauce. Soy sauce, including reduced-sodium. Steak sauce. Canned and packaged gravies. Fish sauce. Oyster sauce. Cocktail sauce. Horseradish that you find on the shelf. Ketchup. Mustard. Meat flavorings and tenderizers. Bouillon cubes. Hot sauce and Tabasco sauce. Premade or packaged marinades. Premade or packaged taco seasonings. Relishes. Regular salad dressings. Where to find more information:  National Heart, Lung, and [place]: https://wilson-eaton.com/  American Heart Association: www.heart.org Summary  The DASH eating plan is a healthy eating plan that has been shown to reduce high blood pressure (hypertension). It may also reduce your risk for type 2 diabetes, heart disease, and stroke.  With the DASH eating plan, you should  limit salt (sodium) intake to 2,300 mg a day. If you have hypertension, you may need to reduce your sodium intake to 1,500 mg a day.  When on the DASH eating plan, aim to eat more fresh fruits and vegetables, whole grains, lean proteins, low-fat dairy, and heart-healthy fats.  Work with your health care provider or diet and nutrition specialist (dietitian) to adjust your eating plan to your individual calorie needs. This information is not intended to replace advice given to  you by your health care provider. Make sure you discuss any questions you have with your health care provider. Document Released: 01/06/2011 Document Revised: 12/30/2016 Document Reviewed: 01/11/2016 Elsevier Patient Education  2020 [employer].

## 2018-12-20 ENCOUNTER — Other Ambulatory Visit: Payer: Self-pay

## 2018-12-20 DIAGNOSIS — Z20822 Contact with and (suspected) exposure to covid-19: Secondary | ICD-10-CM

## 2018-12-22 LAB — NOVEL CORONAVIRUS, NAA: SARS-CoV-2, NAA: NOT DETECTED

## 2019-01-08 ENCOUNTER — Ambulatory Visit: Payer: Managed Care, Other (non HMO) | Admitting: Family Medicine

## 2019-01-21 ENCOUNTER — Telehealth: Payer: Self-pay | Admitting: Family Medicine

## 2019-01-21 DIAGNOSIS — J4530 Mild persistent asthma, uncomplicated: Secondary | ICD-10-CM

## 2019-01-21 DIAGNOSIS — I10 Essential (primary) hypertension: Secondary | ICD-10-CM

## 2019-01-21 MED ORDER — AMLODIPINE BESYLATE 5 MG PO TABS: 5.0000 mg | ORAL_TABLET | Freq: Every day | ORAL | 2 refills | Status: DC

## 2019-01-21 MED ORDER — BUDESONIDE-FORMOTEROL FUMARATE 160-4.5 MCG/ACT IN AERO: 2.0000 | INHALATION_SPRAY | Freq: Two times a day (BID) | RESPIRATORY_TRACT | 0 refills | Status: DC

## 2019-01-21 NOTE — Telephone Encounter (Signed)
Sent in meds 

## 2019-01-21 NOTE — Telephone Encounter (Signed)
Pt called for refills of Symbicort and Amlodipine. Please send to [facility] in [facility]. Pt can be reached at [phone].

## 2019-01-22 ENCOUNTER — Other Ambulatory Visit: Payer: Self-pay | Admitting: Family Medicine

## 2019-01-22 DIAGNOSIS — E559 Vitamin D deficiency, unspecified: Secondary | ICD-10-CM

## 2019-02-04 ENCOUNTER — Ambulatory Visit: Payer: Managed Care, Other (non HMO) | Admitting: Family Medicine

## 2019-02-19 ENCOUNTER — Other Ambulatory Visit: Payer: Self-pay

## 2019-02-19 ENCOUNTER — Encounter: Payer: Self-pay | Admitting: Family Medicine

## 2019-02-19 ENCOUNTER — Ambulatory Visit: Payer: Managed Care, Other (non HMO) | Admitting: Family Medicine

## 2019-02-19 VITALS — BP 146/94 | HR 99 | Temp 97.7°F | Wt 260.0 lb

## 2019-02-19 DIAGNOSIS — E7800 Pure hypercholesterolemia, unspecified: Secondary | ICD-10-CM

## 2019-02-19 DIAGNOSIS — J4530 Mild persistent asthma, uncomplicated: Secondary | ICD-10-CM

## 2019-02-19 DIAGNOSIS — E559 Vitamin D deficiency, unspecified: Secondary | ICD-10-CM

## 2019-02-19 DIAGNOSIS — R748 Abnormal levels of other serum enzymes: Secondary | ICD-10-CM

## 2019-02-19 DIAGNOSIS — I10 Essential (primary) hypertension: Secondary | ICD-10-CM

## 2019-02-19 MED ORDER — BUDESONIDE-FORMOTEROL FUMARATE 160-4.5 MCG/ACT IN AERO: 2.0000 | INHALATION_SPRAY | Freq: Two times a day (BID) | RESPIRATORY_TRACT | 0 refills | Status: DC

## 2019-02-19 MED ORDER — LOSARTAN POTASSIUM-HCTZ 100-12.5 MG PO TABS: 1.0000 | ORAL_TABLET | Freq: Every day | ORAL | 1 refills | Status: DC

## 2019-02-19 MED ORDER — ALBUTEROL SULFATE HFA 108 (90 BASE) MCG/ACT IN AERS: 2.0000 | INHALATION_SPRAY | Freq: Four times a day (QID) | RESPIRATORY_TRACT | 0 refills | Status: DC | PRN

## 2019-02-19 MED ORDER — AMLODIPINE BESYLATE 5 MG PO TABS: 5.0000 mg | ORAL_TABLET | Freq: Every day | ORAL | 2 refills | Status: DC

## 2019-02-19 NOTE — Progress Notes (Signed)
   Subjective:    Patient ID: Derek Powell, male    DOB: 02-Nov-1990, 29 y.o.   MRN: [id number]  HPI Chief Complaint  Patient presents with  . 2 month follow-up    follow-up. no checking bp   Here to follow up on HTN. He is fasting.  Reports good compliance with daily Losartan 100 mg and amlodipine 5 mg.   States his BP at home has been 150s-160s/ 90 but he does not check it often.  States his diet is fairly high in sodium.  Has not been exercising regularly lately. Request refill of HTN medications.    Vitamin D deficiency- he is taking an OTC vitamin D supplement.   History of elevated liver enzymes. Negative acute hepatitis panel. Has declined US liver and referral to GI. Needs labs States he drank alcohol over the weekend.   Hypercalcemia- needs recheck of serum calcium.   Asthma- well controlled on Symbicort. Albuterol -only using this when exercises.  Is not smoking cigarettes. Smokes marijuana.  Requests refill of albuterol inhaler and Symbicort.   LDL 182 in April 2020.  He is taking fish oil. Is not on statin. Needs lipid panel today. Fasting.   Denies fever, chills, dizziness, chest pain, palpitations, shortness of breath, abdominal pain, N/V/D, urinary symptoms, LE edema.   Reviewed allergies, medications, past medical, surgical, family, and social history.    Review of Systems Pertinent positives and negatives in the history of present illness.     Objective:   Physical Exam BP (!) 146/94   Pulse 99   Temp 97.7 F (36.5 C)   Wt 260 lb (117.9 kg)   BMI 35.26 kg/m   Alert and in no distress.  Neck is supple without adenopathy or thyromegaly. Cardiac exam shows a regular rhythm without murmurs or gallops. Lungs are clear to auscultation. Abdomen soft, non distended, normal BS, non tender, no palpable masses. Extremities without edema. Skin is warm and dry.        Assessment & Plan:  Uncontrolled hypertension - Plan: CBC with  Differential/Platelet, Comprehensive metabolic panel, EKG 12-Lead, losartan-hydrochlorothiazide (HYZAAR) 100-12.5 MG tablet, amLODipine (NORVASC) 5 MG tablet  -ECG shos NSR with sinus arrhythmia. Rate 73. Unremarkable. Read by Dr. Susann Givens.  Counseling on uncontrolled HTN and potential health consequences. Strongly encouraged to cut back on sodium, alcohol and keep a close eye on his BP at home. Start on losartan/HCTZ and continue on amlodipine.  Follow up in 4 weeks.   Vitamin D deficiency - Plan: VITAMIN D 25 Hydroxy (Vit-D Deficiency, Fractures) -check vitamin D level and adjust dose as appropriate   Hypercalcemia - Plan: Comprehensive metabolic panel, PTH, Intact and Calcium -consider underlying etiologies including vitamin D def, parathyroid. Check labs and follow up  Elevated liver enzymes - Plan: Comprehensive metabolic panel -negative acute hepatitis panel last year. Encouraged cessation of alcohol. Discussed potential need to get liver US or referral to GI pending results.   Elevated LDL cholesterol level - Plan: Lipid panel -check lipids and follow up. Will evaluate ASCVD to determine need for statin therapy  Mild persistent asthma without complication - Plan: albuterol (VENTOLIN HFA) 108 (90 Base) MCG/ACT inhaler, budesonide-formoterol (SYMBICORT) 160-4.5 MCG/ACT inhaler -seems to be controlled. Continue on Symbicort and albuterol prn

## 2019-02-19 NOTE — Patient Instructions (Addendum)
Your BP is elevated today and not well controlled.   I am adding another medication to your current regimen.   Cut back on sodium.

## 2019-02-20 ENCOUNTER — Other Ambulatory Visit: Payer: Self-pay | Admitting: Family Medicine

## 2019-02-20 DIAGNOSIS — E559 Vitamin D deficiency, unspecified: Secondary | ICD-10-CM

## 2019-02-20 LAB — LIPID PANEL
Chol/HDL Ratio: 2.3 ratio (ref 0.0–5.0)
Cholesterol, Total: 220 mg/dL — ABNORMAL HIGH (ref 100–199)
HDL: 95 mg/dL (ref >39)
LDL Chol Calc (NIH): 86 mg/dL (ref 0–99)
Triglycerides: 239 mg/dL — ABNORMAL HIGH (ref 0–149)
VLDL Cholesterol Cal: 39 mg/dL (ref 5–40)

## 2019-02-20 LAB — VITAMIN D 25 HYDROXY (VIT D DEFICIENCY, FRACTURES): Vit D, 25-Hydroxy: 17.4 ng/mL — ABNORMAL LOW (ref 30.0–100.0)

## 2019-02-20 LAB — CBC WITH DIFFERENTIAL/PLATELET
Basophils Absolute: 0.1 10*3/uL (ref 0.0–0.2)
Basos: 1 %
EOS (ABSOLUTE): 0.3 10*3/uL (ref 0.0–0.4)
Eos: 6 %
Hematocrit: 49.2 % (ref 37.5–51.0)
Hemoglobin: 16.4 g/dL (ref 13.0–17.7)
Immature Grans (Abs): 0 10*3/uL (ref 0.0–0.1)
Immature Granulocytes: 1 %
Lymphocytes Absolute: 1.5 10*3/uL (ref 0.7–3.1)
Lymphs: 28 %
MCH: 30.4 pg (ref 26.6–33.0)
MCHC: 33.3 g/dL (ref 31.5–35.7)
MCV: 91 fL (ref 79–97)
Monocytes Absolute: 0.6 10*3/uL (ref 0.1–0.9)
Monocytes: 11 %
Neutrophils Absolute: 2.9 10*3/uL (ref 1.4–7.0)
Neutrophils: 53 %
Platelets: 238 10*3/uL (ref 150–450)
RBC: 5.39 x10E6/uL (ref 4.14–5.80)
RDW: 13.1 % (ref 11.6–15.4)
WBC: 5.4 10*3/uL (ref 3.4–10.8)

## 2019-02-20 LAB — COMPREHENSIVE METABOLIC PANEL
ALT: 96 IU/L — ABNORMAL HIGH (ref 0–44)
AST: 51 IU/L — ABNORMAL HIGH (ref 0–40)
Albumin/Globulin Ratio: 1.9 (ref 1.2–2.2)
Albumin: 5 g/dL (ref 4.1–5.2)
Alkaline Phosphatase: 59 IU/L (ref 39–117)
BUN/Creatinine Ratio: 15 (ref 9–20)
BUN: 14 mg/dL (ref 6–20)
Bilirubin Total: 0.5 mg/dL (ref 0.0–1.2)
CO2: 22 mmol/L (ref 20–29)
Calcium: 10.6 mg/dL — ABNORMAL HIGH (ref 8.7–10.2)
Chloride: 99 mmol/L (ref 96–106)
Creatinine, Ser: 0.95 mg/dL (ref 0.76–1.27)
GFR calc Af Amer: 124 mL/min/{1.73_m2} (ref >59)
GFR calc non Af Amer: 107 mL/min/{1.73_m2} (ref >59)
Globulin, Total: 2.6 g/dL (ref 1.5–4.5)
Glucose: 100 mg/dL — ABNORMAL HIGH (ref 65–99)
Potassium: 4.4 mmol/L (ref 3.5–5.2)
Sodium: 137 mmol/L (ref 134–144)
Total Protein: 7.6 g/dL (ref 6.0–8.5)

## 2019-02-20 LAB — PTH, INTACT AND CALCIUM: PTH: 39 pg/mL (ref 15–65)

## 2019-02-20 MED ORDER — VITAMIN D (ERGOCALCIFEROL) 1.25 MG (50000 UNIT) PO CAPS: 50000.0000 [IU] | ORAL_CAPSULE | ORAL | 0 refills | Status: DC

## 2019-02-20 NOTE — Progress Notes (Signed)
I recommend a referral to GI due to persistently elevated liver enzymes. Please refer him.

## 2019-02-21 ENCOUNTER — Other Ambulatory Visit: Payer: Self-pay | Admitting: Internal Medicine

## 2019-02-21 DIAGNOSIS — R748 Abnormal levels of other serum enzymes: Secondary | ICD-10-CM

## 2019-03-07 ENCOUNTER — Other Ambulatory Visit: Payer: Self-pay | Admitting: Internal Medicine

## 2019-03-07 DIAGNOSIS — J4530 Mild persistent asthma, uncomplicated: Secondary | ICD-10-CM

## 2019-03-07 MED ORDER — BUDESONIDE-FORMOTEROL FUMARATE 160-4.5 MCG/ACT IN AERO: 2.0000 | INHALATION_SPRAY | Freq: Two times a day (BID) | RESPIRATORY_TRACT | 1 refills | Status: DC

## 2019-03-12 ENCOUNTER — Other Ambulatory Visit: Payer: Self-pay | Admitting: Internal Medicine

## 2019-03-12 ENCOUNTER — Telehealth: Payer: Self-pay

## 2019-03-12 DIAGNOSIS — J4530 Mild persistent asthma, uncomplicated: Secondary | ICD-10-CM

## 2019-03-12 MED ORDER — BUDESONIDE-FORMOTEROL FUMARATE 160-4.5 MCG/ACT IN AERO: 2.0000 | INHALATION_SPRAY | Freq: Two times a day (BID) | RESPIRATORY_TRACT | 1 refills | Status: DC

## 2019-03-12 NOTE — Telephone Encounter (Signed)
I submitted a PA for the pts. Budesonide-formoterol fumarate and per the insurance no PA is required because the brand is covered by the insurance Symbicort.

## 2019-03-20 ENCOUNTER — Ambulatory Visit: Payer: Managed Care, Other (non HMO) | Admitting: Family Medicine

## 2019-03-28 ENCOUNTER — Encounter: Payer: Self-pay | Admitting: Family Medicine

## 2019-04-03 ENCOUNTER — Ambulatory Visit: Payer: Managed Care, Other (non HMO) | Admitting: Family Medicine

## 2019-04-22 ENCOUNTER — Telehealth: Payer: Self-pay | Admitting: Family Medicine

## 2019-04-22 DIAGNOSIS — J4530 Mild persistent asthma, uncomplicated: Secondary | ICD-10-CM

## 2019-04-22 MED ORDER — ALBUTEROL SULFATE HFA 108 (90 BASE) MCG/ACT IN AERS: 2.0000 | INHALATION_SPRAY | Freq: Four times a day (QID) | RESPIRATORY_TRACT | 0 refills | Status: DC | PRN

## 2019-04-22 MED ORDER — BUDESONIDE-FORMOTEROL FUMARATE 160-4.5 MCG/ACT IN AERO: 2.0000 | INHALATION_SPRAY | Freq: Two times a day (BID) | RESPIRATORY_TRACT | 1 refills | Status: DC

## 2019-04-22 NOTE — Telephone Encounter (Signed)
Is this okay to refill these since he lost them

## 2019-04-22 NOTE — Telephone Encounter (Signed)
Pt called and states he went out of town and lost some meds. He is requesting a refill on Albuterol and Symbicort. Please send to [facility] in [place]. Pt can be reached at [phone].

## 2019-04-22 NOTE — Telephone Encounter (Signed)
ok 

## 2019-04-22 NOTE — Telephone Encounter (Signed)
Sent meds in

## 2019-04-25 ENCOUNTER — Telehealth: Payer: Self-pay

## 2019-04-25 NOTE — Telephone Encounter (Signed)
I received another rPA request for the pts. Budesonide-Formoterol Fumarate 106-4.35mcg/act I faxed it back to the pharmacy stating to fill as the brand name which is covered by the insurance Symbicort and which is written on his prescription to fill as the brand name due to insurance not covering the generic.

## 2019-05-09 ENCOUNTER — Other Ambulatory Visit: Payer: Self-pay | Admitting: Family Medicine

## 2019-05-09 DIAGNOSIS — E559 Vitamin D deficiency, unspecified: Secondary | ICD-10-CM

## 2019-05-09 NOTE — Telephone Encounter (Signed)
Pt was already notified to take over the counter when rx runs out

## 2019-05-20 ENCOUNTER — Other Ambulatory Visit: Payer: Self-pay | Admitting: Family Medicine

## 2019-05-20 DIAGNOSIS — J4530 Mild persistent asthma, uncomplicated: Secondary | ICD-10-CM

## 2019-07-08 ENCOUNTER — Other Ambulatory Visit: Payer: Self-pay | Admitting: Family Medicine

## 2019-07-08 DIAGNOSIS — J4530 Mild persistent asthma, uncomplicated: Secondary | ICD-10-CM

## 2019-07-08 NOTE — Telephone Encounter (Signed)
Ok to give one refill.  

## 2019-07-08 NOTE — Telephone Encounter (Signed)
Is this okay to refill?  Pt was advised he needed to have a follow-up on HTN and he could discuss inhaler follow-up as well but pt did not have calendar and would try to schedule later through [facility]

## 2019-07-22 NOTE — Progress Notes (Signed)
   Subjective:    Patient ID: Derek Powell, male    DOB: 12-14-1990, 29 y.o.   MRN: [id number]  HPI Chief Complaint  Patient presents with  . follow-up    follow-up on HTN and Asthma. not checking BP. only taking 1 bp med but not sure med   He is here for a follow up on several chronic health conditions. Overdue for his follow up. No new concerns.   States he is aware that he has gained weight. States he has not been taking care of himself. Diet has been fairly unhealthy.   HTN- stopped HTN medications but started back on Hyzaar 100-12.5 mg 2 weeks ago.  Has not been checking BP at home.   Asthma- using Symbicort daily.  Albuterol - he uses this before exercising. Occasionally wakes up wheezing and uses it.   Elevated liver enzymes- he has not cut back on alcohol use. States he does not want to get an [place] or see GI.   Vitamin D def- has not been taking a supplement regularly  Hypercalcemia- needs rechecked. PTH normal 6 months ago.   Denies fever, chills, dizziness, chest pain, palpitations, shortness of breath, abdominal pain, N/V/D, urinary symptoms, LE edema.    Review of Systems Pertinent positives and negatives in the history of present illness.     Objective:   Physical Exam BP 140/88   Pulse 86   Wt 267 lb 9.6 oz (121.4 kg)   BMI 36.29 kg/m   Alert and in no distress.  Cardiac exam shows a regular  rhythm without murmurs or gallops. Lungs are clear to auscultation. Extremities without edema. Skin is warm and dry.      Assessment & Plan:  Essential hypertension - Plan: Comprehensive metabolic panel, CBC with Differential/Platelet -BP elevated. He only started back on Hyzaar 2 weeks ago. Is not taking amlodipine and we are not sure why or when he stopped.  Recommend he check his BP over the next 4 weeks and follow up virtually.   Mild persistent asthma without complication -fairly well controlled on Symbicort. He will let me know if he is needing albuterol  more often.   Elevated liver enzymes - Plan: Comprehensive metabolic panel -discussed possible liver disease including cirrhosis. I referred him to GI but he did not go. Declines [place]. Recommend stopping alcohol and weight loss.   Elevated LDL cholesterol level -increased risk of cardiovascular disease in the future.   Hypercalcemia - Plan: Comprehensive metabolic panel -needs rechecked. Normal PTH previously   Personal history of noncompliance with medical treatment, presenting hazards to health -discussed that he has not followed recommendations for his health and he is putting himself at risk for worsening health consequences including increased risk for liver disease, cirrhosis and liver cancer. States he is aware. States he cannot financially agree to more testing or seeing a specialist at this time.  No real plan to stop alcohol use.   Vitamin D deficiency - Plan: VITAMIN D 25 Hydroxy (Vit-D Deficiency, Fractures) -check vitamin D level and follow up. Taking supplement sporadically.   Obesity (BMI 30.0-34.9) -counseling on healthy diet and exercise.

## 2019-07-23 ENCOUNTER — Other Ambulatory Visit: Payer: Self-pay

## 2019-07-23 ENCOUNTER — Encounter: Payer: Self-pay | Admitting: Family Medicine

## 2019-07-23 ENCOUNTER — Ambulatory Visit: Payer: Managed Care, Other (non HMO) | Admitting: Family Medicine

## 2019-07-23 VITALS — BP 140/88 | HR 86 | Wt 267.6 lb

## 2019-07-23 DIAGNOSIS — E559 Vitamin D deficiency, unspecified: Secondary | ICD-10-CM | POA: Insufficient documentation

## 2019-07-23 DIAGNOSIS — E669 Obesity, unspecified: Secondary | ICD-10-CM

## 2019-07-23 DIAGNOSIS — Z91199 Patient's noncompliance with other medical treatment and regimen due to unspecified reason: Secondary | ICD-10-CM

## 2019-07-23 DIAGNOSIS — R748 Abnormal levels of other serum enzymes: Secondary | ICD-10-CM

## 2019-07-23 DIAGNOSIS — J4530 Mild persistent asthma, uncomplicated: Secondary | ICD-10-CM | POA: Diagnosis not present

## 2019-07-23 DIAGNOSIS — E7800 Pure hypercholesterolemia, unspecified: Secondary | ICD-10-CM | POA: Diagnosis not present

## 2019-07-23 DIAGNOSIS — I10 Essential (primary) hypertension: Secondary | ICD-10-CM

## 2019-07-23 DIAGNOSIS — Z9119 Patient's noncompliance with other medical treatment and regimen: Secondary | ICD-10-CM

## 2019-07-24 LAB — VITAMIN D 25 HYDROXY (VIT D DEFICIENCY, FRACTURES): Vit D, 25-Hydroxy: 14.7 ng/mL — ABNORMAL LOW (ref 30.0–100.0)

## 2019-07-24 LAB — CBC WITH DIFFERENTIAL/PLATELET
Basophils Absolute: 0.1 10*3/uL (ref 0.0–0.2)
Basos: 1 %
EOS (ABSOLUTE): 0.3 10*3/uL (ref 0.0–0.4)
Eos: 6 %
Hematocrit: 45.8 % (ref 37.5–51.0)
Hemoglobin: 16.3 g/dL (ref 13.0–17.7)
Immature Grans (Abs): 0 10*3/uL (ref 0.0–0.1)
Immature Granulocytes: 1 %
Lymphocytes Absolute: 1.7 10*3/uL (ref 0.7–3.1)
Lymphs: 31 %
MCH: 32 pg (ref 26.6–33.0)
MCHC: 35.6 g/dL (ref 31.5–35.7)
MCV: 90 fL (ref 79–97)
Monocytes Absolute: 0.6 10*3/uL (ref 0.1–0.9)
Monocytes: 11 %
Neutrophils Absolute: 2.8 10*3/uL (ref 1.4–7.0)
Neutrophils: 50 %
Platelets: 235 10*3/uL (ref 150–450)
RBC: 5.1 x10E6/uL (ref 4.14–5.80)
RDW: 13.6 % (ref 11.6–15.4)
WBC: 5.6 10*3/uL (ref 3.4–10.8)

## 2019-07-24 LAB — COMPREHENSIVE METABOLIC PANEL
ALT: 105 IU/L — ABNORMAL HIGH (ref 0–44)
AST: 63 IU/L — ABNORMAL HIGH (ref 0–40)
Albumin/Globulin Ratio: 2 (ref 1.2–2.2)
Albumin: 5.1 g/dL (ref 4.1–5.2)
Alkaline Phosphatase: 56 IU/L (ref 48–121)
BUN/Creatinine Ratio: 14 (ref 9–20)
BUN: 13 mg/dL (ref 6–20)
Bilirubin Total: 0.4 mg/dL (ref 0.0–1.2)
CO2: 24 mmol/L (ref 20–29)
Calcium: 11 mg/dL — ABNORMAL HIGH (ref 8.7–10.2)
Chloride: 98 mmol/L (ref 96–106)
Creatinine, Ser: 0.94 mg/dL (ref 0.76–1.27)
GFR calc Af Amer: 125 mL/min/{1.73_m2} (ref >59)
GFR calc non Af Amer: 108 mL/min/{1.73_m2} (ref >59)
Globulin, Total: 2.5 g/dL (ref 1.5–4.5)
Glucose: 110 mg/dL — ABNORMAL HIGH (ref 65–99)
Potassium: 4.8 mmol/L (ref 3.5–5.2)
Sodium: 137 mmol/L (ref 134–144)
Total Protein: 7.6 g/dL (ref 6.0–8.5)

## 2019-07-24 NOTE — Progress Notes (Signed)
Please see if maria can add a TSH and free T4 to his las from yesterday. Can she check a cortisol level or do we need to have him come back in?

## 2019-07-29 ENCOUNTER — Encounter: Payer: Self-pay | Admitting: Internal Medicine

## 2019-07-29 NOTE — Progress Notes (Signed)
Please send the message in a letter to his home as well along with his lab results.  Thanks.

## 2019-08-07 ENCOUNTER — Ambulatory Visit: Payer: Managed Care, Other (non HMO) | Admitting: Family Medicine

## 2019-08-07 ENCOUNTER — Encounter: Payer: Self-pay | Admitting: Family Medicine

## 2019-08-07 ENCOUNTER — Other Ambulatory Visit: Payer: Self-pay

## 2019-08-07 DIAGNOSIS — I10 Essential (primary) hypertension: Secondary | ICD-10-CM | POA: Diagnosis not present

## 2019-08-07 DIAGNOSIS — R748 Abnormal levels of other serum enzymes: Secondary | ICD-10-CM

## 2019-08-07 DIAGNOSIS — J4530 Mild persistent asthma, uncomplicated: Secondary | ICD-10-CM

## 2019-08-07 DIAGNOSIS — E559 Vitamin D deficiency, unspecified: Secondary | ICD-10-CM | POA: Diagnosis not present

## 2019-08-07 MED ORDER — AMLODIPINE BESYLATE 5 MG PO TABS: 5.0000 mg | ORAL_TABLET | Freq: Every day | ORAL | 2 refills | Status: DC

## 2019-08-07 MED ORDER — BUDESONIDE-FORMOTEROL FUMARATE 160-4.5 MCG/ACT IN AERO: 2.0000 | INHALATION_SPRAY | Freq: Two times a day (BID) | RESPIRATORY_TRACT | 1 refills | Status: DC

## 2019-08-07 MED ORDER — VITAMIN D (ERGOCALCIFEROL) 1.25 MG (50000 UNIT) PO CAPS: 50000.0000 [IU] | ORAL_CAPSULE | ORAL | 0 refills | Status: DC

## 2019-08-07 NOTE — Progress Notes (Signed)
   Subjective:    Patient ID: Derek Powell, male    DOB: [birth year], 29 y.o.   MRN: [id number]  HPI Chief Complaint  Patient presents with  . follow-up    follow-up on labs.    He is here to discuss abnormal labs.   His serum calcium has been elevated and worsening. Vitamin D is also low and he needs to start on a supplement which I prescribe today.  Liver enzymes have been worsening. He has declined liver US and referral to GI in the past.  States he has cut way back on alcohol intake. He is agreeable now to get the [place] as I have recommended.   Father with abnormal thyroid history. Unclear diagnosis.   HTN- BP is still not to goal range. He is only taking losartan-HCTZ. It is unclear as to why he stopped amlodipine.  Asthma- much better controlled. Using Symbicort daily.   Denies fever, chills, night sweats, dizziness, headache, chest pain, palpitations, shortness of breath, abdominal pain, N/V/D, urinary symptoms, LE edema.   Reviewed allergies, medications, past medical, surgical, family, and social history.    Review of Systems Pertinent positives and negatives in the history of present illness.      Objective:   Physical Exam BP (!) 132/96   Pulse 78   Temp 98.4 F (36.9 C) (Oral)   Alert and oriented and in no acute distress. Not otherwise examined.       Assessment & Plan:  Hypercalcemia - Plan: CBC with Differential/Platelet, Comprehensive metabolic panel, DG Chest 2 View, Ambulatory referral to Endocrinology, Protein electrophoresis, serum, Protein Electrophoresis, Urine Rflx. -discussed possible etiologies and we have checked for some already.  His PTH is normal.  Vitamin D is severely low and I will start him on prescription supplement.  He is taking HCTZ and considered stopping it but his BP is significantly elevated upon arrival today, it did improve after the visit but still abnormal. I will start him back on amlodipine and consider stopping HCTZ or  the endocrinologist may determine this.  Discussed underlying neoplasm is possible. I will send him for a chest XR.  Liver enzymes worsening and he has history of heavy alcohol use which is he working on.  Agreeable now to get a liver US.   Elevated liver enzymes - Plan: Comprehensive metabolic panel, US Abdomen Limited RUQ -congratulated him on cutting back alcohol intake. He does not take NSAIDs.  [place] ordered, he is agreeable, has not been in the past. Has refused GI referral in past.   Vitamin D deficiency - Plan: Vitamin D, Ergocalciferol, (DRISDOL) 1.25 MG (50000 UNIT) CAPS capsule -prescription sent to pharmacy. Will need follow up vitamin D level in 12 weeks.   Uncontrolled hypertension - Plan: CBC with Differential/Platelet, Comprehensive metabolic panel, amLODipine (NORVASC) 5 MG tablet -continue current medication and start back on amlodipine. Keep an eye on BP at home.   Mild persistent asthma without complication - Plan: budesonide-formoterol (SYMBICORT) 160-4.5 MCG/ACT inhaler -controlled. Continue Symbicort.

## 2019-08-07 NOTE — Patient Instructions (Signed)
I am adding amlodipine to your blood pressure medications. Keep an eye on your blood pressure. Goal reading is <130/80.   You will hear from [facility] to schedule regarding your elevated calcium   We will have more information after your ultrasound and chest X ray and labs.

## 2019-08-07 NOTE — Progress Notes (Signed)
Reviewed labs with patient in person

## 2019-08-08 LAB — COMPREHENSIVE METABOLIC PANEL
ALT: 116 IU/L — ABNORMAL HIGH (ref 0–44)
AST: 69 IU/L — ABNORMAL HIGH (ref 0–40)
Albumin/Globulin Ratio: 1.8 (ref 1.2–2.2)
Albumin: 5.3 g/dL — ABNORMAL HIGH (ref 4.1–5.2)
Alkaline Phosphatase: 62 IU/L (ref 48–121)
BUN/Creatinine Ratio: 13 (ref 9–20)
BUN: 14 mg/dL (ref 6–20)
Bilirubin Total: 0.4 mg/dL (ref 0.0–1.2)
CO2: 22 mmol/L (ref 20–29)
Calcium: 11.3 mg/dL — ABNORMAL HIGH (ref 8.7–10.2)
Chloride: 97 mmol/L (ref 96–106)
Creatinine, Ser: 1.07 mg/dL (ref 0.76–1.27)
GFR calc Af Amer: 107 mL/min/{1.73_m2} (ref >59)
GFR calc non Af Amer: 93 mL/min/{1.73_m2} (ref >59)
Globulin, Total: 3 g/dL (ref 1.5–4.5)
Glucose: 102 mg/dL — ABNORMAL HIGH (ref 65–99)
Potassium: 4.3 mmol/L (ref 3.5–5.2)
Sodium: 138 mmol/L (ref 134–144)
Total Protein: 8.3 g/dL (ref 6.0–8.5)

## 2019-08-08 LAB — CBC WITH DIFFERENTIAL/PLATELET
Basophils Absolute: 0.1 10*3/uL (ref 0.0–0.2)
Basos: 1 %
EOS (ABSOLUTE): 0.2 10*3/uL (ref 0.0–0.4)
Eos: 4 %
Hematocrit: 48.2 % (ref 37.5–51.0)
Hemoglobin: 16.6 g/dL (ref 13.0–17.7)
Immature Grans (Abs): 0 10*3/uL (ref 0.0–0.1)
Immature Granulocytes: 0 %
Lymphocytes Absolute: 1.9 10*3/uL (ref 0.7–3.1)
Lymphs: 32 %
MCH: 31.1 pg (ref 26.6–33.0)
MCHC: 34.4 g/dL (ref 31.5–35.7)
MCV: 90 fL (ref 79–97)
Monocytes Absolute: 0.7 10*3/uL (ref 0.1–0.9)
Monocytes: 12 %
Neutrophils Absolute: 3 10*3/uL (ref 1.4–7.0)
Neutrophils: 51 %
Platelets: 263 10*3/uL (ref 150–450)
RBC: 5.34 x10E6/uL (ref 4.14–5.80)
RDW: 13.5 % (ref 11.6–15.4)
WBC: 5.9 10*3/uL (ref 3.4–10.8)

## 2019-08-08 LAB — SPECIMEN STATUS REPORT

## 2019-08-08 LAB — CORTISOL: Cortisol: 10.7 ug/dL

## 2019-08-08 LAB — TSH+FREE T4
Free T4: 1.23 ng/dL (ref 0.82–1.77)
TSH: 4.08 u[IU]/mL (ref 0.450–4.500)

## 2019-08-09 LAB — PROTEIN ELECTROPHORESIS, URINE REFLEX
Albumin ELP, Urine: 100 %
Alpha-1-Globulin, U: 0 %
Alpha-2-Globulin, U: 0 %
Beta Globulin, U: 0 %
Gamma Globulin, U: 0 %
Protein, Ur: <4 mg/dL

## 2019-08-09 LAB — PROTEIN ELECTROPHORESIS, SERUM
A/G Ratio: 1.3 (ref 0.7–1.7)
Albumin ELP: 4.5 g/dL — ABNORMAL HIGH (ref 2.9–4.4)
Alpha 1: 0.2 g/dL (ref 0.0–0.4)
Alpha 2: 0.8 g/dL (ref 0.4–1.0)
Beta: 1.4 g/dL — ABNORMAL HIGH (ref 0.7–1.3)
Gamma Globulin: 1.1 g/dL (ref 0.4–1.8)
Globulin, Total: 3.6 g/dL (ref 2.2–3.9)
Total Protein: 8.1 g/dL (ref 6.0–8.5)

## 2019-08-14 ENCOUNTER — Ambulatory Visit
Admission: RE | Admit: 2019-08-14 | Discharge: 2019-08-14 | Disposition: A | Discharge status: Home / Self Care | Payer: Managed Care, Other (non HMO) | Source: Ambulatory Visit | Attending: Family Medicine | Admitting: Family Medicine

## 2019-08-14 DIAGNOSIS — R748 Abnormal levels of other serum enzymes: Secondary | ICD-10-CM

## 2019-08-15 ENCOUNTER — Institutional Professional Consult (permissible substitution): Payer: Self-pay | Admitting: Family Medicine

## 2019-08-19 NOTE — Progress Notes (Signed)
Name: Derek Powell  MRN/ DOB: [id number], Feb 27, 1990    Age/ Sex: 29 y.o., male    PCP: Avanell Shackleton, NP-C   Reason for Endocrinology Evaluation: Hypercalcemia      Date of Initial Endocrinology Evaluation: 08/20/2019     HPI: [name] is a 29 y.o. male with a past medical history of HTN. The patient presented for initial endocrinology clinic visit on 08/20/2019 for consultative assistance with his hypercalcemia   Mr. Clinger indicates that he was first diagnosed with hypercalcemia in 2020. Since that time, he {has not experienced symptoms of constipation, polyuria, polydipsia, generalized weakness, diffuse muscle pains, significant memory impairment. He denies use of over the counter calcium (including supplements, Tums, Rolaids, or other calcium containing antacids), or lithium   He is  HCTZ  Takes vitamin D supplements occasionally  He denies history of kidney stones, kidney disease, but has liver disease,but no  granulomatous disease. He  Denies  osteoporosis or prior fractures. Daily dietary calcium intake: 2 servings . He denies family history of osteoporosis, parathyroid disease,   Father with overactive  thyroid     HISTORY:  Past Medical History:  Past Medical History:  Diagnosis Date  . Asthma   . Elevated liver enzymes   . HTN (hypertension)   . Hypercalcemia 04/25/2018    Past Surgical History: No past surgical history on file.   Social History:  reports that he has been smoking cigars. He has never used smokeless tobacco. He reports current alcohol use. He reports current drug use. Drug: Marijuana.  Family History: family history includes Depression in his brother and mother; Heart attack in his maternal grandfather; Heart disease in his maternal grandfather and paternal grandfather; Hyperlipidemia in his father; Hypertension in his brother and father; Suicidality in his brother.   HOME MEDICATIONS: Allergies as of 08/20/2019   No  Known Allergies     Medication List       Accurate as of August 20, 2019  9:22 AM. If you have any questions, ask your nurse or doctor.        albuterol 108 (90 Base) MCG/ACT inhaler Commonly known as: VENTOLIN HFA TAKE 2 PUFFS BY MOUTH EVERY 6 HOURS AS NEEDED FOR WHEEZE OR SHORTNESS OF BREATH   amLODipine 5 MG tablet Commonly known as: NORVASC Take 1 tablet (5 mg total) by mouth daily.   budesonide-formoterol 160-4.5 MCG/ACT inhaler Commonly known as: SYMBICORT Inhale 2 puffs into the lungs 2 (two) times daily.   FISH OIL PO Take by mouth.   losartan-hydrochlorothiazide 100-12.5 MG tablet Commonly known as: HYZAAR Take 1 tablet by mouth daily.   MELATONIN PO Take by mouth. With CBD   Vitamin D (Ergocalciferol) 1.25 MG (50000 UNIT) Caps capsule Commonly known as: DRISDOL Take 1 capsule (50,000 Units total) by mouth every 7 (seven) days.   VITAMIN D BOOSTER PO Take by mouth.         REVIEW OF SYSTEMS: A comprehensive ROS was conducted with the patient and is negative except as per HPI and below:  ROS     OBJECTIVE:  VS: BP (!) 152/98 (BP Location: Right Arm, Patient Position: Sitting, Cuff Size: Normal)   Pulse 92   Ht 6' (1.829 m)   Wt 259 lb 6.4 oz (117.7 kg)   SpO2 98%   BMI 35.18 kg/m    Wt Readings from Last 3 Encounters:  08/20/19 259 lb 6.4 oz (117.7 kg)  07/23/19 267 lb 9.6 oz (121.4 kg)  02/19/19 260 lb (117.9 kg)     EXAM: General: Pt appears well and is in NAD  Neck: General: Supple without adenopathy. Thyroid: Thyroid size normal.  No goiter or nodules appreciated. No thyroid bruit.  Lungs: Clear with good BS bilat with no rales, rhonchi, or wheezes  Heart: Auscultation: RRR.  Abdomen: Normoactive bowel sounds, soft, nontender, without masses or organomegaly palpable  Extremities:  BL LE: No pretibial edema normal ROM and strength.  Skin: Hair: Texture and amount normal with gender appropriate distribution Skin Inspection: No  rashes Skin Palpation: Skin temperature, texture, and thickness normal to palpation  Neuro: Cranial nerves: II - XII grossly intact  Motor: Normal strength throughout DTRs: 2+ and symmetric in UE without delay in relaxation phase  Mental Status: Judgment, insight: Intact Orientation: Oriented to time, place, and person Mood and affect: No depression, anxiety, or agitation     DATA REVIEWED: Results for NICKIE, DEREN (MRN [id number]) as of 08/21/2019 10:20  Ref. Range 08/20/2019 09:36  Sodium Latest Ref Range: 135 - 145 mEq/L 136  Potassium Latest Ref Range: 3.5 - 5.1 mEq/L 4.3  Chloride Latest Ref Range: 96 - 112 mEq/L 100  CO2 Latest Ref Range: 19 - 32 mEq/L 30  Glucose Latest Ref Range: 70 - 99 mg/dL 373 (H)  BUN Latest Ref Range: 6 - 23 mg/dL 16  Creatinine Latest Ref Range: 0.40 - 1.50 mg/dL 4.28  Calcium Latest Ref Range: 8.4 - 10.5 mg/dL 76.8 (H)  Calcium Ionized Latest Ref Range: 4.8 - 5.6 mg/dL 1.15 (H)  Alkaline Phosphatase Latest Ref Range: 39 - 117 U/L 45  Albumin Latest Ref Range: 3.5 - 5.2 g/dL 4.9  AST Latest Ref Range: 0 - 37 U/L 37  ALT Latest Ref Range: 0 - 53 U/L 75 (H)  Total Protein Latest Ref Range: 6.0 - 8.3 g/dL 7.5  Total Bilirubin Latest Ref Range: 0.2 - 1.2 mg/dL 0.7  GGT Latest Ref Range: 7 - 51 U/L 58 (H)  GFR Latest Ref Range: >60.00 mL/min 90.41  VITD Latest Ref Range: 30.00 - 100.00 ng/mL 18.63 (L)      ASSESSMENT/PLAN/RECOMMENDATIONS:   1. Hypercalcemia :  - Pt asymptomatic  - Ionized calcium is elevated  - The  differential diagnosis of PTH-mediated hypercalcemia is familial hypercalcinuric hypercalcemia (FHH) vs Primary Hyperparathyroidism (pHPT)  - It is important to differentiate between the two, as FHH does not cause any organ damage and does not require further follow up , on the other hand pHPT could cause end organ damage and would require further evaluation.  - Pt will need to have 24-hr urine collection but this will  Have to be  put on hold for now until vitamin D levels have been replenished  - I am also going to stop his HCTZ     Recommendations : Encouraged hydration  Avoid OTC calcium supplements Maintain 2-3 servings of low fat dairy daily  Stop Losartan -HCTZ and start Losartan only 100 mg daily    2. Vitamin D Deficiency :  - Pt advised to restart ergocalciferol 50,000 iu weekly, once he is done with this prescription to start OTC vitamin D3 2000 iu daily    3. Steatohepatitis :  - Pt with elevated LFT's and radiological evidence of steatohepatitis, he has made lifestyle changes with weight loss and alcohol cessation with LFT's trending down.  - Pt encouraged to continue with lifestyle changes     Addendum: Discussed labs and new recommendations with the pt on 08/21/2019  at 9:30 AM   Signed electronically by: Lyndle Herrlich, MD  [facility]  [facility] [address] [place], [place] [address] Phone: [phone] FAX: [phone]   CC: Avanell Shackleton, NP-C [address] [address] Phone: [phone] Fax: [phone]   Return to Endocrinology clinic as below: Future Appointments  Date Time Provider Department Center  11/07/2019  8:30 AM Avanell Shackleton, NP-C [facility]

## 2019-08-20 ENCOUNTER — Encounter: Payer: Self-pay | Admitting: Internal Medicine

## 2019-08-20 ENCOUNTER — Ambulatory Visit: Payer: Managed Care, Other (non HMO) | Admitting: Internal Medicine

## 2019-08-20 ENCOUNTER — Other Ambulatory Visit: Payer: Self-pay

## 2019-08-20 DIAGNOSIS — E559 Vitamin D deficiency, unspecified: Secondary | ICD-10-CM | POA: Diagnosis not present

## 2019-08-20 DIAGNOSIS — K760 Fatty (change of) liver, not elsewhere classified: Secondary | ICD-10-CM | POA: Diagnosis not present

## 2019-08-20 LAB — COMPREHENSIVE METABOLIC PANEL
ALT: 75 U/L — ABNORMAL HIGH (ref 0–53)
AST: 37 U/L (ref 0–37)
Albumin: 4.9 g/dL (ref 3.5–5.2)
Alkaline Phosphatase: 45 U/L (ref 39–117)
BUN: 16 mg/dL (ref 6–23)
CO2: 30 mEq/L (ref 19–32)
Calcium: 10.8 mg/dL — ABNORMAL HIGH (ref 8.4–10.5)
Chloride: 100 mEq/L (ref 96–112)
Creatinine, Ser: 0.97 mg/dL (ref 0.40–1.50)
GFR: 90.41 mL/min (ref >60.00)
Glucose, Bld: 116 mg/dL — ABNORMAL HIGH (ref 70–99)
Potassium: 4.3 mEq/L (ref 3.5–5.1)
Sodium: 136 mEq/L (ref 135–145)
Total Bilirubin: 0.7 mg/dL (ref 0.2–1.2)
Total Protein: 7.5 g/dL (ref 6.0–8.3)

## 2019-08-20 LAB — GAMMA GT: GGT: 58 U/L — ABNORMAL HIGH (ref 7–51)

## 2019-08-20 LAB — VITAMIN D 25 HYDROXY (VIT D DEFICIENCY, FRACTURES): VITD: 18.63 ng/mL — ABNORMAL LOW (ref 30.00–100.00)

## 2019-08-20 NOTE — Patient Instructions (Signed)
-   Please stop by the lab today - Please maintain proper hydration  - Avoid over the counter calcium tablets - Maintain 2-3 servings of calcium daily in your food  - Vitamin D 1000 iu daily

## 2019-08-21 LAB — PARATHYROID HORMONE, INTACT (NO CA): PTH: 23 pg/mL (ref 14–64)

## 2019-08-21 LAB — CALCIUM, IONIZED: Calcium, Ion: 5.65 mg/dL — ABNORMAL HIGH (ref 4.8–5.6)

## 2019-08-21 MED ORDER — LOSARTAN POTASSIUM 100 MG PO TABS
100.0000 mg | ORAL_TABLET | Freq: Every day | ORAL | 1 refills | Status: DC
Start: 2019-08-21 — End: 2020-03-20

## 2019-11-07 ENCOUNTER — Ambulatory Visit: Payer: Managed Care, Other (non HMO) | Admitting: Family Medicine

## 2019-11-28 ENCOUNTER — Ambulatory Visit: Payer: Managed Care, Other (non HMO) | Admitting: Family Medicine

## 2019-11-28 ENCOUNTER — Other Ambulatory Visit: Payer: Self-pay | Admitting: Internal Medicine

## 2019-11-28 ENCOUNTER — Encounter: Payer: Self-pay | Admitting: Internal Medicine

## 2019-11-28 DIAGNOSIS — J4530 Mild persistent asthma, uncomplicated: Secondary | ICD-10-CM

## 2019-11-28 MED ORDER — BUDESONIDE-FORMOTEROL FUMARATE 160-4.5 MCG/ACT IN AERO: 2.0000 | INHALATION_SPRAY | Freq: Two times a day (BID) | RESPIRATORY_TRACT | 0 refills | Status: DC

## 2019-12-03 ENCOUNTER — Ambulatory Visit: Payer: Self-pay | Admitting: Family Medicine

## 2019-12-10 ENCOUNTER — Other Ambulatory Visit: Payer: Self-pay

## 2019-12-10 ENCOUNTER — Encounter: Payer: Self-pay | Admitting: Family Medicine

## 2019-12-10 ENCOUNTER — Telehealth: Payer: BC Managed Care – PPO | Admitting: Family Medicine

## 2019-12-10 VITALS — Wt 250.0 lb

## 2019-12-10 DIAGNOSIS — K760 Fatty (change of) liver, not elsewhere classified: Secondary | ICD-10-CM

## 2019-12-10 DIAGNOSIS — E559 Vitamin D deficiency, unspecified: Secondary | ICD-10-CM | POA: Diagnosis not present

## 2019-12-10 DIAGNOSIS — R748 Abnormal levels of other serum enzymes: Secondary | ICD-10-CM

## 2019-12-10 DIAGNOSIS — R41840 Attention and concentration deficit: Secondary | ICD-10-CM

## 2019-12-10 DIAGNOSIS — R454 Irritability and anger: Secondary | ICD-10-CM

## 2019-12-10 DIAGNOSIS — E7800 Pure hypercholesterolemia, unspecified: Secondary | ICD-10-CM

## 2019-12-10 DIAGNOSIS — J4530 Mild persistent asthma, uncomplicated: Secondary | ICD-10-CM | POA: Diagnosis not present

## 2019-12-10 NOTE — Progress Notes (Signed)
Subjective:  Documentation for virtual audio and video telecommunications through Caregility encounter:  The patient was located at home. 2 patient identifiers used.  The provider was located in the office. The patient did consent to this visit and is aware of possible charges through their insurance for this visit.  The other persons participating in this telemedicine service were none. Time spent on call was 22 minutes and in review of previous records 25 minutes total.  This virtual service is not related to other E/M service within previous 7 days.    Patient ID: Derek Powell, male    DOB: 1990/09/29, 29 y.o.   MRN: [id number]  HPI Chief Complaint  Patient presents with   follow-up    follow-up on vitamin D,    This is a follow up on chronic health conditions.   States his mental health has been taking a "nose dive" over the past 2 months. He wants to be checked for ADHD.  States he gets "thrown off". Using a planner. Can't focus.  States he talked to a therapist once.   Thinks he may have some underlying depression, it runs in his family.  Brother severely depressed and has attempted suicide twice.  Mother with depression.  He assures me he is not having any SI Is not self medicating.   Vitamin D def- followed by endocrinology currently. States he finished his high dose vitamin D and is taking 1,000 IUs   HTN- taking losartan only now. HCTZ was stopped due to hypercalcemia and hyperparathyroidism.   Asthma- uses Symbicort daily and albuterol inhaler before exercise. No flare ups.   Hypercalcemia- Under the care of endocrinology Has visit schedule for 12/17/2019  Elevated LFTs and fatty liver- US 08/2019 LFTs trending down  States he no longer drinks during the week. He drinks some on the weekends.  Smokes marijuana.  Cooking more, less processed and fast food.   He has lost some weight.   Lipids- eating healthier now.   Denies fever, chills,  dizziness, chest pain, palpitations, shortness of breath, abdominal pain, N/V/D, urinary symptoms, LE edema.     Review of Systems Pertinent positives and negatives in the history of present illness.     Objective:   Physical Exam Wt 250 lb (113.4 kg)    BMI 33.91 kg/m   Alert and oriented and in no acute distress.  Respirations unlabored.  Normal speech, mood and thought process.  Denies SI      Assessment & Plan:  Difficulty concentrating -He is concerned about worsening issues with concentration and focus.  Denies history of ADHD but he would like to be tested officially for this.  Concentration issues now affecting his home life and work.  Irritability -He has noticed being on edge more recently.  Strong family history of mental illness specifically depression.  He does not appear to be in any danger.  He is not self-medicating.  I will refer him to psychiatry for further evaluation and treatment and he is on board with this.  Vitamin D deficiency -Continue taking vitamin D supplement.  Discussed taking 2000 IUs daily until he sees his endocrinologist.  Mild persistent asthma without complication -Asthma is well controlled.  Continue on Symbicort daily.  Continue albuterol as needed and prior to workouts.  He will follow-up if he is needing this more often.  Elevated LDL cholesterol level -Continue with healthy low-fat diet.  Recommend getting at least 150 minutes of physical activity each week.  Elevated liver enzymes -  Trending downward.  He is doing a good job cutting back on alcohol.  Hepatic steatosis -Discussed taking good care of his liver by avoiding NSAIDs and alcohol.  Weight loss will most likely help with this.  Hypercalcemia -Under the care of endocrinology.

## 2019-12-10 NOTE — Patient Instructions (Signed)
You can call to schedule your appointment with the psychiatrist/counselor. A few offices are listed below for you to call.    [facility]  Ask for a psychiatrist  [address]  (across from [facility])  [phone]    [facility] [address] [place], [place] [address] [phone]   [facility]  [address], [place], [place] [address]  Phone: [phone]   [facility] [address] [place], [place] [address]  Phone: [phone] 

## 2019-12-17 ENCOUNTER — Other Ambulatory Visit: Payer: Self-pay

## 2019-12-17 ENCOUNTER — Ambulatory Visit (INDEPENDENT_AMBULATORY_CARE_PROVIDER_SITE_OTHER): Payer: BC Managed Care – PPO | Admitting: Internal Medicine

## 2019-12-17 VITALS — BP 140/90 | HR 90 | Temp 98.1°F | Resp 12 | Ht 72.0 in | Wt 260.2 lb

## 2019-12-17 DIAGNOSIS — E559 Vitamin D deficiency, unspecified: Secondary | ICD-10-CM

## 2019-12-17 DIAGNOSIS — E213 Hyperparathyroidism, unspecified: Secondary | ICD-10-CM | POA: Diagnosis not present

## 2019-12-17 NOTE — Progress Notes (Signed)
Name: Derek Powell  MRN/ DOB: [id number], 05-Apr-1990    Age/ Sex: 29 y.o., male     PCP: Avanell Shackleton, NP-C   Reason for Endocrinology Evaluation: Hypercalcemia      Initial Endocrinology Clinic Visit: 08/20/2019    PATIENT IDENTIFIER: Derek Powell is a 29 y.o., male with a past medical history of HTN. He has followed with [facility] since 08/20/2019 for consultative assistance with management of his Hypercalcemia .   HISTORICAL SUMMARY: The patient was first diagnosed with hypercalcemia in 2020  With inappropriately normal PTH  Ranging from 23-39 pg/mL   On his initial visit he was on HCTZ which was stopped. Repeat labs in 12/2019 showed normalization   No prior dx of nephrolithiasis, osteoporosis or prior fractures   Father with thyroid disease   SUBJECTIVE:    Today (12/17/2019):  [name] is here for hypercalcemia.  Denies polyuria or polydipsia  Denies constipation  Denies renal stones   Vitamin D 1000 iu 2 caps daily  He tries to stay hydrated  Doesn't always consume 2 servings of dietary calcium      HISTORY:  Past Medical History:  Past Medical History:  Diagnosis Date  . Asthma   . Elevated liver enzymes   . HTN (hypertension)   . Hypercalcemia 04/25/2018    Past Surgical History: No past surgical history on file.  Social History:  reports that he has been smoking cigars. He has never used smokeless tobacco. He reports current alcohol use. He reports current drug use. Drug: Marijuana. Family History:  Family History  Problem Relation Age of Onset  . Hypertension Father   . Hyperlipidemia Father   . Hypertension Brother   . Depression Brother   . Suicidality Brother   . Depression Mother   . Heart disease Maternal Grandfather        24s  . Heart attack Maternal Grandfather   . Heart disease Paternal Grandfather        19s     HOME MEDICATIONS: Allergies as of 12/17/2019   No Known Allergies       Medication List       Accurate as of December 17, 2019  9:40 AM. If you have any questions, ask your nurse or doctor.        albuterol 108 (90 Base) MCG/ACT inhaler Commonly known as: VENTOLIN HFA TAKE 2 PUFFS BY MOUTH EVERY 6 HOURS AS NEEDED FOR WHEEZE OR SHORTNESS OF BREATH   budesonide-formoterol 160-4.5 MCG/ACT inhaler Commonly known as: SYMBICORT Inhale 2 puffs into the lungs 2 (two) times daily.   losartan 100 MG tablet Commonly known as: COZAAR Take 1 tablet (100 mg total) by mouth daily.   Vitamin D (Ergocalciferol) 1.25 MG (50000 UNIT) Caps capsule Commonly known as: DRISDOL Take 1 capsule (50,000 Units total) by mouth every 7 (seven) days.   Vitamin D3 25 MCG (1000 UT) Caps Take 2 capsules by mouth daily.         OBJECTIVE:   PHYSICAL EXAM: VS: BP 140/90 (BP Location: Left Arm, Cuff Size: Large)   Pulse 90   Temp 98.1 F (36.7 C) (Oral)   Resp 12   Ht 6' (1.829 m)   Wt 260 lb 3.2 oz (118 kg)   SpO2 97%   BMI 35.29 kg/m    EXAM: General: Pt appears well and is in NAD  Neck: General: Supple without adenopathy. Thyroid: Thyroid size normal.  No goiter or nodules appreciated.  Lungs: Clear with good BS bilat with no rales, rhonchi, or wheezes  Heart: Auscultation: RRR.  Abdomen: Normoactive bowel sounds, soft, nontender, without masses or organomegaly palpable  Extremities:  BL LE: No pretibial edema normal ROM and strength.  Mental Status: Judgment, insight: Intact Orientation: Oriented to time, place, and person Mood and affect: No depression, anxiety, or agitation     DATA REVIEWED:  Results for KOY, LAMP (MRN [id number]) as of 12/19/2019 08:49  Ref. Range 12/17/2019 09:43  Sodium Latest Ref Range: 134 - 144 mmol/L 139  Potassium Latest Ref Range: 3.5 - 5.2 mmol/L 4.6  Chloride Latest Ref Range: 96 - 106 mmol/L 101  CO2 Latest Ref Range: 20 - 29 mmol/L 26  Glucose Latest Ref Range: 65 - 99 mg/dL 841 (H)  BUN Latest Ref Range: 6  - 20 mg/dL 14  Creatinine Latest Ref Range: 0.76 - 1.27 mg/dL 6.60  Calcium Latest Ref Range: 8.7 - 10.2 mg/dL 63.0 (H)  BUN/Creatinine Ratio Latest Ref Range: 9 - 20  13  Calcium Ionized Latest Ref Range: 4.5 - 5.6 mg/dL 5.6  Phosphorus Latest Ref Range: 2.8 - 4.1 mg/dL 2.5 (L)  Albumin Latest Ref Range: 4.0 - 5.0 g/dL 5.0  GFR, Est Non African American Latest Ref Range: >59 mL/min/1.73 93  GFR, Est African American Latest Ref Range: >59 mL/min/1.73 108  Vitamin D, 25-Hydroxy Latest Ref Range: 30.0 - 100.0 ng/mL 18.2 (L)  PTH, Intact Latest Ref Range: 15 - 65 pg/mL 26     ASSESSMENT / PLAN / RECOMMENDATIONS:   1. Hyperparathyroidism:  - Pt asymptomatic  - His PTH , Ionized calcium and corrected serum calcium have normalized with a corrected serum calcium of  9.8 mg/dL now that he is off HCTZ.  - Pt with an underlying parathyroid gland abnormality. HCTZ does not cause hypercalcemia per se only in those with an underlying parathyroid physiology.  - This will require monitoring due to the chance that it may worsen over the years.  - He has been off HCTZ and will proceed with 24- hr urinary Ca/Cr excretions    Recommendations : Encouraged hydration  Avoid OTC calcium supplements Maintain 2-3 servings of low fat dairy daily     2. Vitamin D Deficiency :  - This is improving but still low Will increase as below    Increase Vitamin D3 to 3000 iu daily   3. Hypophosphatemia:  - Will encourage  High phosphorus diet such as [place], chicken and low fat daily .    F/U in 4 months      Signed electronically by: Lyndle Herrlich, MD   Signed electronically by: Lyndle Herrlich, MD  [facility]  [facility] [address] [place], [place] [address] Phone: [phone] FAX: [phone]      CC: Avanell Shackleton, NP-C [address] [place] [address] Phone: [phone]  Fax:  [phone]   Return to Endocrinology clinic as below: Future Appointments  Date Time Provider Department Center  12/20/2019  9:15 AM Avanell Shackleton, NP-C [facility]

## 2019-12-17 NOTE — Patient Instructions (Addendum)
-   Please maintain proper hydration  - Avoid over the counter calcium tablets - Maintain 2-3 servings of calcium daily in your food  - Continue Vitamin D 2000 iu daily     24-Hour Urine Collection   You will be collecting your urine for a 24-hour period of time.  Your timer starts with your first urine of the morning (For example - If you first pee at 9AM, your timer will start at 9AM)  Throw away your first urine of the morning  Collect your urine every time you pee for the next 24 hours STOP your urine collection 24 hours after you started the collection (For example - You would stop at 9AM the day after you started)

## 2019-12-18 LAB — BASIC METABOLIC PANEL
BUN/Creatinine Ratio: 13 (ref 9–20)
BUN: 14 mg/dL (ref 6–20)
CO2: 26 mmol/L (ref 20–29)
Calcium: 10.6 mg/dL — ABNORMAL HIGH (ref 8.7–10.2)
Chloride: 101 mmol/L (ref 96–106)
Creatinine, Ser: 1.06 mg/dL (ref 0.76–1.27)
GFR calc Af Amer: 108 mL/min/{1.73_m2} (ref >59)
GFR calc non Af Amer: 93 mL/min/{1.73_m2} (ref >59)
Glucose: 118 mg/dL — ABNORMAL HIGH (ref 65–99)
Potassium: 4.6 mmol/L (ref 3.5–5.2)
Sodium: 139 mmol/L (ref 134–144)

## 2019-12-18 LAB — ALBUMIN: Albumin: 5 g/dL (ref 4.0–5.0)

## 2019-12-18 LAB — CALCIUM, IONIZED: Calcium, Ion: 5.6 mg/dL (ref 4.5–5.6)

## 2019-12-18 LAB — PHOSPHORUS: Phosphorus: 2.5 mg/dL — ABNORMAL LOW (ref 2.8–4.1)

## 2019-12-18 LAB — VITAMIN D 25 HYDROXY (VIT D DEFICIENCY, FRACTURES): Vit D, 25-Hydroxy: 18.2 ng/mL — ABNORMAL LOW (ref 30.0–100.0)

## 2019-12-18 LAB — PARATHYROID HORMONE, INTACT (NO CA): PTH: 26 pg/mL (ref 15–65)

## 2019-12-19 ENCOUNTER — Telehealth: Payer: Self-pay | Admitting: Internal Medicine

## 2019-12-19 DIAGNOSIS — E213 Hyperparathyroidism, unspecified: Secondary | ICD-10-CM | POA: Insufficient documentation

## 2019-12-19 NOTE — Telephone Encounter (Signed)
Attempted to call the pt on 12/19/2019 to discuss results but there was no answer.   Left a message to log in to the portal.    Abby Raelyn Mora, MD  [facility]  [facility] [address] [place], [place] [address] Phone: [phone] FAX: [phone]

## 2019-12-20 ENCOUNTER — Ambulatory Visit: Payer: BC Managed Care – PPO | Admitting: Family Medicine

## 2019-12-20 ENCOUNTER — Encounter: Payer: Self-pay | Admitting: Family Medicine

## 2019-12-20 ENCOUNTER — Other Ambulatory Visit: Payer: Self-pay

## 2019-12-20 VITALS — BP 120/84 | HR 95 | Ht 72.5 in | Wt 264.8 lb

## 2019-12-20 DIAGNOSIS — I10 Essential (primary) hypertension: Secondary | ICD-10-CM | POA: Diagnosis not present

## 2019-12-20 DIAGNOSIS — E7800 Pure hypercholesterolemia, unspecified: Secondary | ICD-10-CM

## 2019-12-20 DIAGNOSIS — Z23 Encounter for immunization: Secondary | ICD-10-CM | POA: Diagnosis not present

## 2019-12-20 DIAGNOSIS — J4530 Mild persistent asthma, uncomplicated: Secondary | ICD-10-CM

## 2019-12-20 DIAGNOSIS — Z0000 Encounter for general adult medical examination without abnormal findings: Secondary | ICD-10-CM | POA: Diagnosis not present

## 2019-12-20 MED ORDER — ALBUTEROL SULFATE HFA 108 (90 BASE) MCG/ACT IN AERS: INHALATION_SPRAY | RESPIRATORY_TRACT | 2 refills | Status: DC

## 2019-12-20 NOTE — Progress Notes (Signed)
Subjective:    Patient ID: Derek Powell, male    DOB: Nov 04, 1990, 29 y.o.   MRN: [id number]  HPI Chief Complaint  Patient presents with  . cpe    fasitng cpe- will get flu and pfizer   He is here for a complete physical exam.  Other providers: Dr. Brooks Sailors - endocrinology   Has upcoming appt with Dr. Lance Coon at [facility]  Asthma- dong well, no flares. Need refill of albuterol.  Premedicates for exercise. Takes Symbicort daily. He is still smoking marijuana.   HTN- taking losartan daily.    Social history: Lives with wife, works for [employer] he has cut alcohol intake back to weekends only.   Diet: eating healthier  Exercise: 3 days per week   Immunizations:  Health maintenance:  Colonoscopy: never  Last PSA: never  [place] Ex[place]. Permament retainers since age 59  Last Eye Exam: years   Wears seatbelt always, uses sunscreen, smoke detectors in home and functioning, does not text while driving, feels safe in home environment.  Reviewed allergies, medications, past medical, surgical, family, and social history.    Review of Systems Pertinent positives and negatives in the history of present illness.     Objective:   Physical Exam BP 120/84   Pulse 95   Ht 6' 0.5" (1.842 m)   Wt 264 lb 12.8 oz (120.1 kg)   SpO2 97%   BMI 35.42 kg/m   General Appearance:    Alert, cooperative, no distress, appears stated age  Head:    Normocephalic, without obvious abnormality, atraumatic  Eyes:    PERRL, conjunctiva/corneas clear, EOM's intact  Ears:    Normal TM's and external ear canals  Nose:   Mask on   Throat:   Mask on   Neck:   Supple, no lymphadenopathy;  thyroid:  no   enlargement/tenderness/nodules; no JVD  Back:    Spine nontender, no curvature, ROM normal, no CVA     tenderness  Lungs:     Clear to auscultation bilaterally without wheezes, rales or     ronchi; respirations unlabored  Chest Wall:    No tenderness or  deformity   Heart:    Regular rate and rhythm, S1 and S2 normal, no murmur, rub   or gallop  Breast Exam:    No chest wall tenderness, masses or gynecomastia  Abdomen:     Soft, non-tender, nondistended, normoactive bowel sounds,    no masses, no hepatosplenomegaly  Genitalia:    Declines   Rectal:   Deferred due to age <40 and lack of symptoms  Extremities:   No clubbing, cyanosis or edema  Pulses:   2+ and symmetric all extremities  Skin:   Skin color, texture, turgor normal, no rashes or lesions  Lymph nodes:   Cervical, supraclavicular, and axillary nodes normal  Neurologic:   CNII-XII intact, normal strength, sensation and gait          Psych:   Normal mood, affect, hygiene and grooming.         Assessment & Plan:  Routine general medical examination at a health care facility -Here today for CPE.  Recent labs done with his endocrinologist.  Declines labs today.  Preventive health care reviewed.  Recommend regular self testicular exams.  Recommend regular dental and eye exams.  Counseling on healthy lifestyle including diet and exercise.  He is aware that smoking marijuana is not good for his lungs especially with his asthma.  Immunizations reviewed and updated.  Discussed safety. He is under the care of Dr. Brooks Sailors for for hyperparathyroidism and vitamin D deficiency  Mild persistent asthma without complication - Plan: albuterol (VENTOLIN HFA) 108 (90 Base) MCG/ACT inhaler -Asthma controlled.  Continue Symbicort daily.  Continue premedicating with albuterol before exercise.  Elevated LDL cholesterol level -Recommend low-fat, low-cholesterol diet and increasing physical activity.    Primary hypertension -Diabetes in goal range today.  Continue current medication.  Continue low-sodium diet and exercise regularly.  COVID-19 vaccine administered - Plan: Pfizer SARS-COV-2 Vaccine -Counseling on potential side effects  Needs flu shot - Plan: Flu Vaccine QUAD 36+ mos IM

## 2019-12-20 NOTE — Patient Instructions (Signed)
Preventive Care 19-31 Years Old, Male Preventive care refers to lifestyle choices and visits with your health care provider that can promote health and wellness. This includes:  A yearly physical exam. This is also called an annual well check.  Regular dental and eye exams.  Immunizations.  Screening for certain conditions.  Healthy lifestyle choices, such as eating a healthy diet, getting regular exercise, not using drugs or products that contain nicotine and tobacco, and limiting alcohol use. What can I expect for my preventive care visit? Physical exam Your health care provider will check:  Height and weight. These may be used to calculate body mass index (BMI), which is a measurement that tells if you are at a healthy weight.  Heart rate and blood pressure.  Your skin for abnormal spots. Counseling Your health care provider may ask you questions about:  Alcohol, tobacco, and drug use.  Emotional well-being.  Home and relationship well-being.  Sexual activity.  Eating habits.  Work and work Statistician. What immunizations do I need?  Influenza (flu) vaccine  This is recommended every year. Tetanus, diphtheria, and pertussis (Tdap) vaccine  You may need a Td booster every 10 years. Varicella (chickenpox) vaccine  You may need this vaccine if you have not already been vaccinated. Human papillomavirus (HPV) vaccine  If recommended by your health care provider, you may need three doses over 6 months. Measles, mumps, and rubella (MMR) vaccine  You may need at least one dose of MMR. You may also need a second dose. Meningococcal conjugate (MenACWY) vaccine  One dose is recommended if you are 45-76 years old and a Market researcher living in a residence hall, or if you have one of several medical conditions. You may also need additional booster doses. Pneumococcal conjugate (PCV13) vaccine  You may need this if you have certain conditions and were not  previously vaccinated. Pneumococcal polysaccharide (PPSV23) vaccine  You may need one or two doses if you smoke cigarettes or if you have certain conditions. Hepatitis A vaccine  You may need this if you have certain conditions or if you travel or work in places where you may be exposed to hepatitis A. Hepatitis B vaccine  You may need this if you have certain conditions or if you travel or work in places where you may be exposed to hepatitis B. Haemophilus influenzae type b (Hib) vaccine  You may need this if you have certain risk factors. You may receive vaccines as individual doses or as more than one vaccine together in one shot (combination vaccines). Talk with your health care provider about the risks and benefits of combination vaccines. What tests do I need? Blood tests  Lipid and cholesterol levels. These may be checked every 5 years starting at age 17.  Hepatitis C test.  Hepatitis B test. Screening   Diabetes screening. This is done by checking your blood sugar (glucose) after you have not eaten for a while (fasting).  Sexually transmitted disease (STD) testing. Talk with your health care provider about your test results, treatment options, and if necessary, the need for more tests. Follow these instructions at home: Eating and drinking   Eat a diet that includes fresh fruits and vegetables, whole grains, lean protein, and low-fat dairy products.  Take vitamin and mineral supplements as recommended by your health care provider.  Do not drink alcohol if your health care provider tells you not to drink.  If you drink alcohol: ? Limit how much you have to 0-2  drinks a day. ? Be aware of how much alcohol is in your drink. In the [place] one drink equals one 12 oz bottle of beer (355 mL), one 5 oz glass of wine (148 mL), or one 1 oz glass of hard liquor (44 mL). Lifestyle  Take daily care of your teeth and gums.  Stay active. Exercise for at least 30 minutes on 5 or  more days each week.  Do not use any products that contain nicotine or tobacco, such as cigarettes, e-cigarettes, and chewing tobacco. If you need help quitting, ask your health care provider.  If you are sexually active, practice safe sex. Use a condom or other form of protection to prevent STIs (sexually transmitted infections). What's next?  Go to your health care provider once a year for a well check visit.  Ask your health care provider how often you should have your eyes and teeth checked.  Stay up to date on all vaccines. This information is not intended to replace advice given to you by your health care provider. Make sure you discuss any questions you have with your health care provider. Document Revised: 01/11/2018 Document Reviewed: 01/11/2018 Elsevier Patient Education  2020 [employer].

## 2020-01-17 DIAGNOSIS — F902 Attention-deficit hyperactivity disorder, combined type: Secondary | ICD-10-CM | POA: Diagnosis not present

## 2020-01-21 DIAGNOSIS — Z79891 Long term (current) use of opiate analgesic: Secondary | ICD-10-CM | POA: Diagnosis not present

## 2020-02-04 ENCOUNTER — Telehealth: Payer: Self-pay

## 2020-02-04 DIAGNOSIS — J4530 Mild persistent asthma, uncomplicated: Secondary | ICD-10-CM

## 2020-02-04 MED ORDER — BUDESONIDE-FORMOTEROL FUMARATE 160-4.5 MCG/ACT IN AERO: 2.0000 | INHALATION_SPRAY | Freq: Two times a day (BID) | RESPIRATORY_TRACT | 0 refills | Status: DC

## 2020-02-04 NOTE — Telephone Encounter (Signed)
Pt. Called requesting a refill on the his Symbicort last apt was 12/20/19 and next apt is 06/25/20.

## 2020-02-04 NOTE — Telephone Encounter (Signed)
Sent in refill

## 2020-02-14 DIAGNOSIS — F902 Attention-deficit hyperactivity disorder, combined type: Secondary | ICD-10-CM | POA: Diagnosis not present

## 2020-03-19 ENCOUNTER — Telehealth: Payer: Self-pay | Admitting: Internal Medicine

## 2020-03-19 NOTE — Telephone Encounter (Signed)
Pt called and states that pharmacy said losartan 100mg  is on back order and 50mg  is not currently on back order but can be hard to get as well. Please advise as pt is running low on medication

## 2020-03-19 NOTE — Telephone Encounter (Signed)
Pt will check with other pharmacies and see if another pharmacy as it in stock and will let us know so we can send it

## 2020-03-19 NOTE — Telephone Encounter (Signed)
They are on national back order is what he was told

## 2020-03-19 NOTE — Telephone Encounter (Signed)
Do we need to check with a different pharmacy?

## 2020-03-20 ENCOUNTER — Telehealth: Payer: Self-pay | Admitting: Internal Medicine

## 2020-03-20 MED ORDER — LOSARTAN POTASSIUM 100 MG PO TABS: 100.0000 mg | ORAL_TABLET | Freq: Every day | ORAL | 1 refills | Status: DC

## 2020-03-20 NOTE — Telephone Encounter (Signed)
Sent in med

## 2020-04-10 ENCOUNTER — Encounter: Payer: Self-pay | Admitting: Family Medicine

## 2020-04-14 ENCOUNTER — Ambulatory Visit: Payer: Self-pay | Admitting: Internal Medicine

## 2020-04-14 ENCOUNTER — Encounter: Payer: Self-pay | Admitting: Family Medicine

## 2020-04-14 DIAGNOSIS — F902 Attention-deficit hyperactivity disorder, combined type: Secondary | ICD-10-CM | POA: Diagnosis not present

## 2020-05-26 ENCOUNTER — Encounter: Payer: Self-pay | Admitting: Family Medicine

## 2020-05-26 DIAGNOSIS — J4530 Mild persistent asthma, uncomplicated: Secondary | ICD-10-CM

## 2020-05-26 MED ORDER — BUDESONIDE-FORMOTEROL FUMARATE 160-4.5 MCG/ACT IN AERO: 2.0000 | INHALATION_SPRAY | Freq: Two times a day (BID) | RESPIRATORY_TRACT | 1 refills | Status: DC

## 2020-05-26 MED ORDER — ALBUTEROL SULFATE HFA 108 (90 BASE) MCG/ACT IN AERS: INHALATION_SPRAY | RESPIRATORY_TRACT | 2 refills | Status: DC

## 2020-06-25 ENCOUNTER — Encounter: Payer: BC Managed Care – PPO | Admitting: Family Medicine

## 2020-07-09 ENCOUNTER — Encounter: Payer: BC Managed Care – PPO | Admitting: Family Medicine

## 2020-07-27 ENCOUNTER — Encounter: Payer: Self-pay | Admitting: Internal Medicine

## 2020-07-29 ENCOUNTER — Encounter: Payer: Self-pay | Admitting: Family Medicine

## 2020-08-14 ENCOUNTER — Other Ambulatory Visit: Payer: Self-pay | Admitting: Family Medicine

## 2020-08-14 DIAGNOSIS — J4530 Mild persistent asthma, uncomplicated: Secondary | ICD-10-CM

## 2020-08-14 NOTE — Telephone Encounter (Signed)
Request for refill on the pts. Albuterol inhaler t. Last apt was 12/20/19 and next apt is 12/21/20.

## 2020-09-09 ENCOUNTER — Other Ambulatory Visit: Payer: Self-pay | Admitting: Medical

## 2020-09-09 DIAGNOSIS — J4530 Mild persistent asthma, uncomplicated: Secondary | ICD-10-CM

## 2020-10-03 ENCOUNTER — Other Ambulatory Visit: Payer: Self-pay | Admitting: Family Medicine

## 2020-10-03 DIAGNOSIS — J4530 Mild persistent asthma, uncomplicated: Secondary | ICD-10-CM

## 2020-10-06 NOTE — Telephone Encounter (Signed)
Received request for a refill on the pts. Albuterol inhaler last apt was 12/20/19 and next apt is 12/21/20.

## 2020-10-07 ENCOUNTER — Telehealth: Payer: Self-pay | Admitting: Family Medicine

## 2020-10-07 DIAGNOSIS — J4530 Mild persistent asthma, uncomplicated: Secondary | ICD-10-CM

## 2020-10-07 MED ORDER — BUDESONIDE-FORMOTEROL FUMARATE 160-4.5 MCG/ACT IN AERO: 2.0000 | INHALATION_SPRAY | Freq: Two times a day (BID) | RESPIRATORY_TRACT | 1 refills | Status: DC

## 2020-10-07 NOTE — Telephone Encounter (Signed)
Is this okay to refill? 

## 2020-10-07 NOTE — Telephone Encounter (Signed)
Needs refill on symbicort   [facility]

## 2020-10-07 NOTE — Telephone Encounter (Signed)
Done

## 2020-10-08 DIAGNOSIS — F902 Attention-deficit hyperactivity disorder, combined type: Secondary | ICD-10-CM | POA: Diagnosis not present

## 2020-10-28 ENCOUNTER — Other Ambulatory Visit: Payer: Self-pay | Admitting: Family Medicine

## 2020-10-28 DIAGNOSIS — J4530 Mild persistent asthma, uncomplicated: Secondary | ICD-10-CM

## 2020-11-19 ENCOUNTER — Other Ambulatory Visit: Payer: Self-pay | Admitting: Family Medicine

## 2020-11-19 DIAGNOSIS — J4530 Mild persistent asthma, uncomplicated: Secondary | ICD-10-CM

## 2020-12-01 IMAGING — CR DG CHEST 2V
2 series · 2 of 2 positions shown · non-contrast
Comparison: None.

CLINICAL DATA: Cough and wheezing

EXAM:
CHEST - 2 VIEW

[w chest pa]
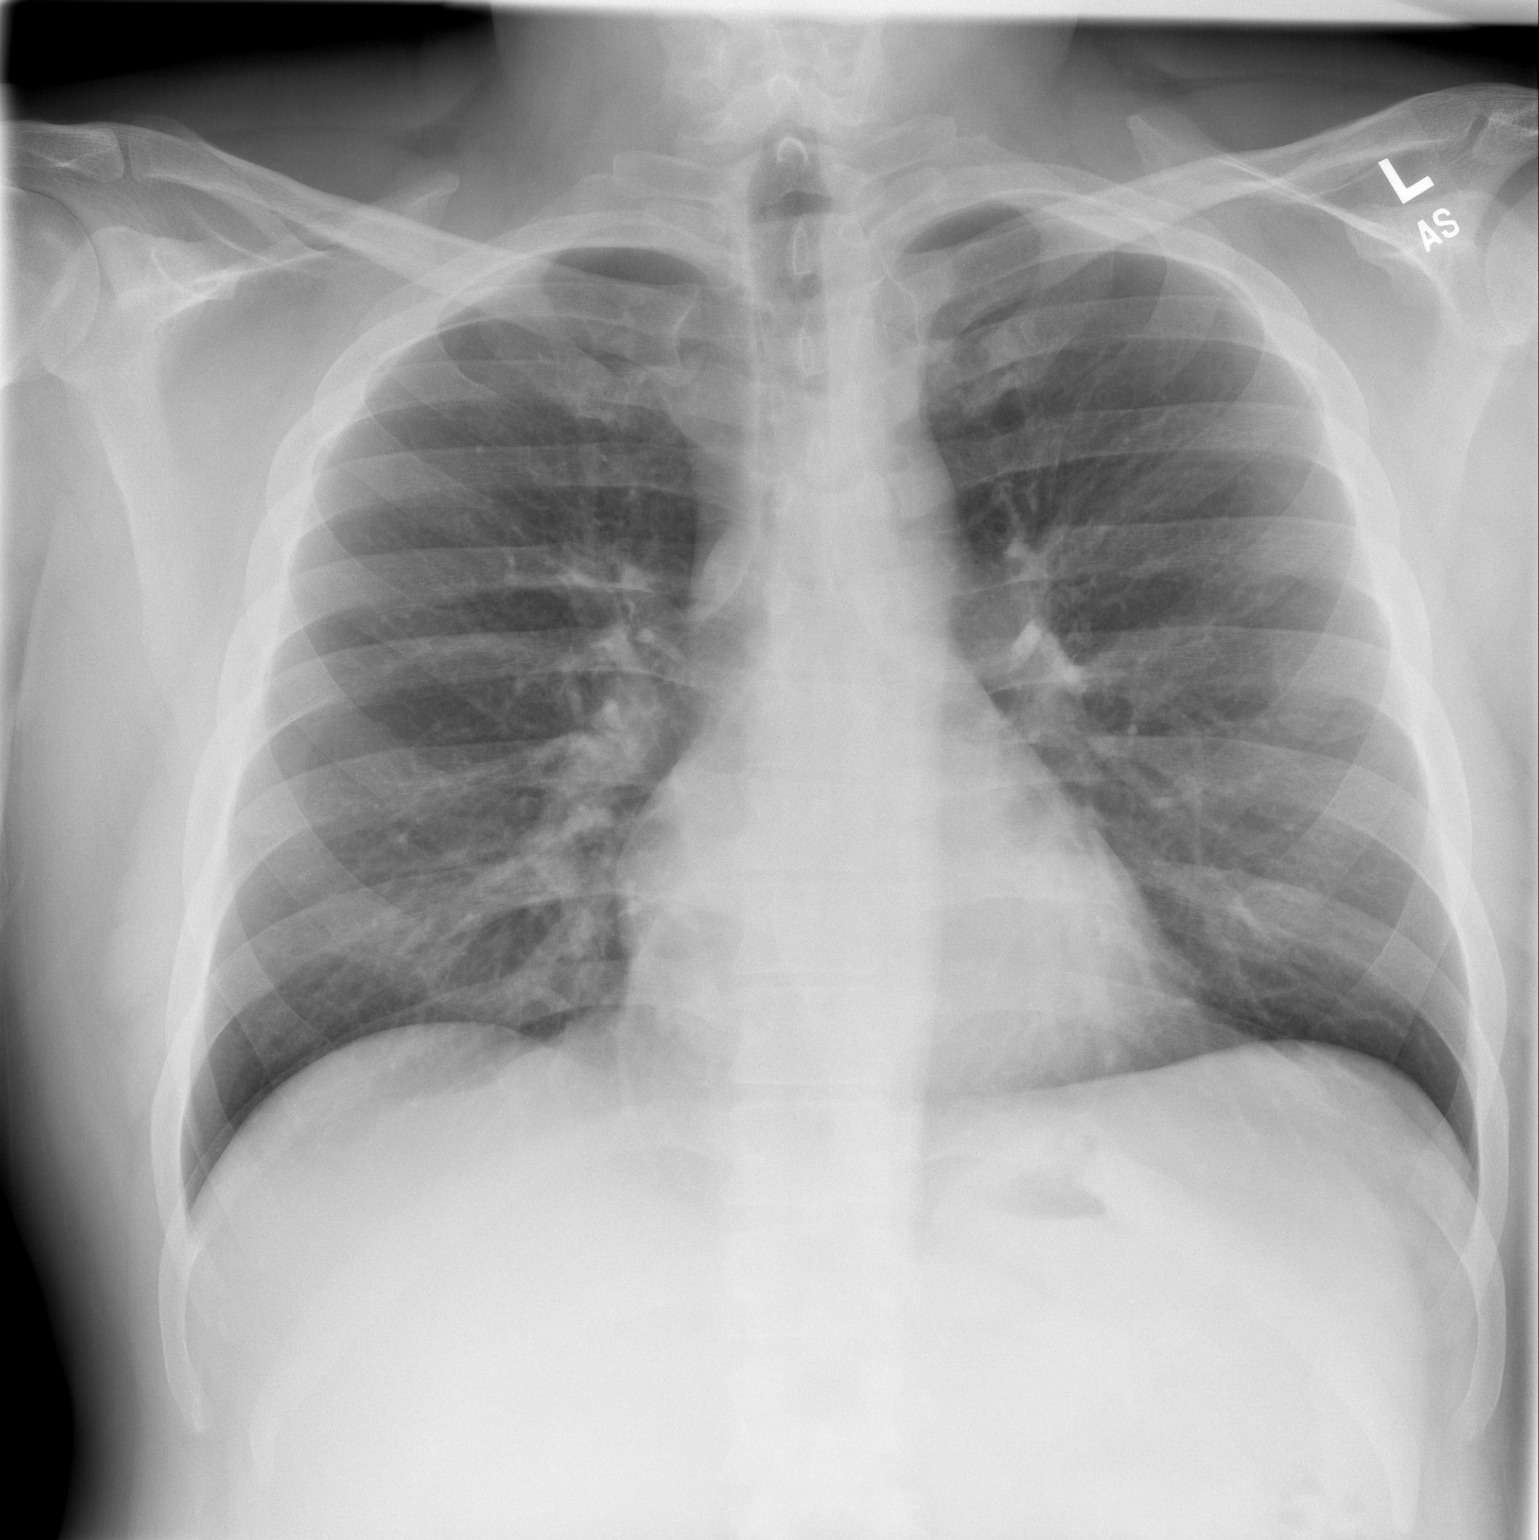

[w chest lat *]
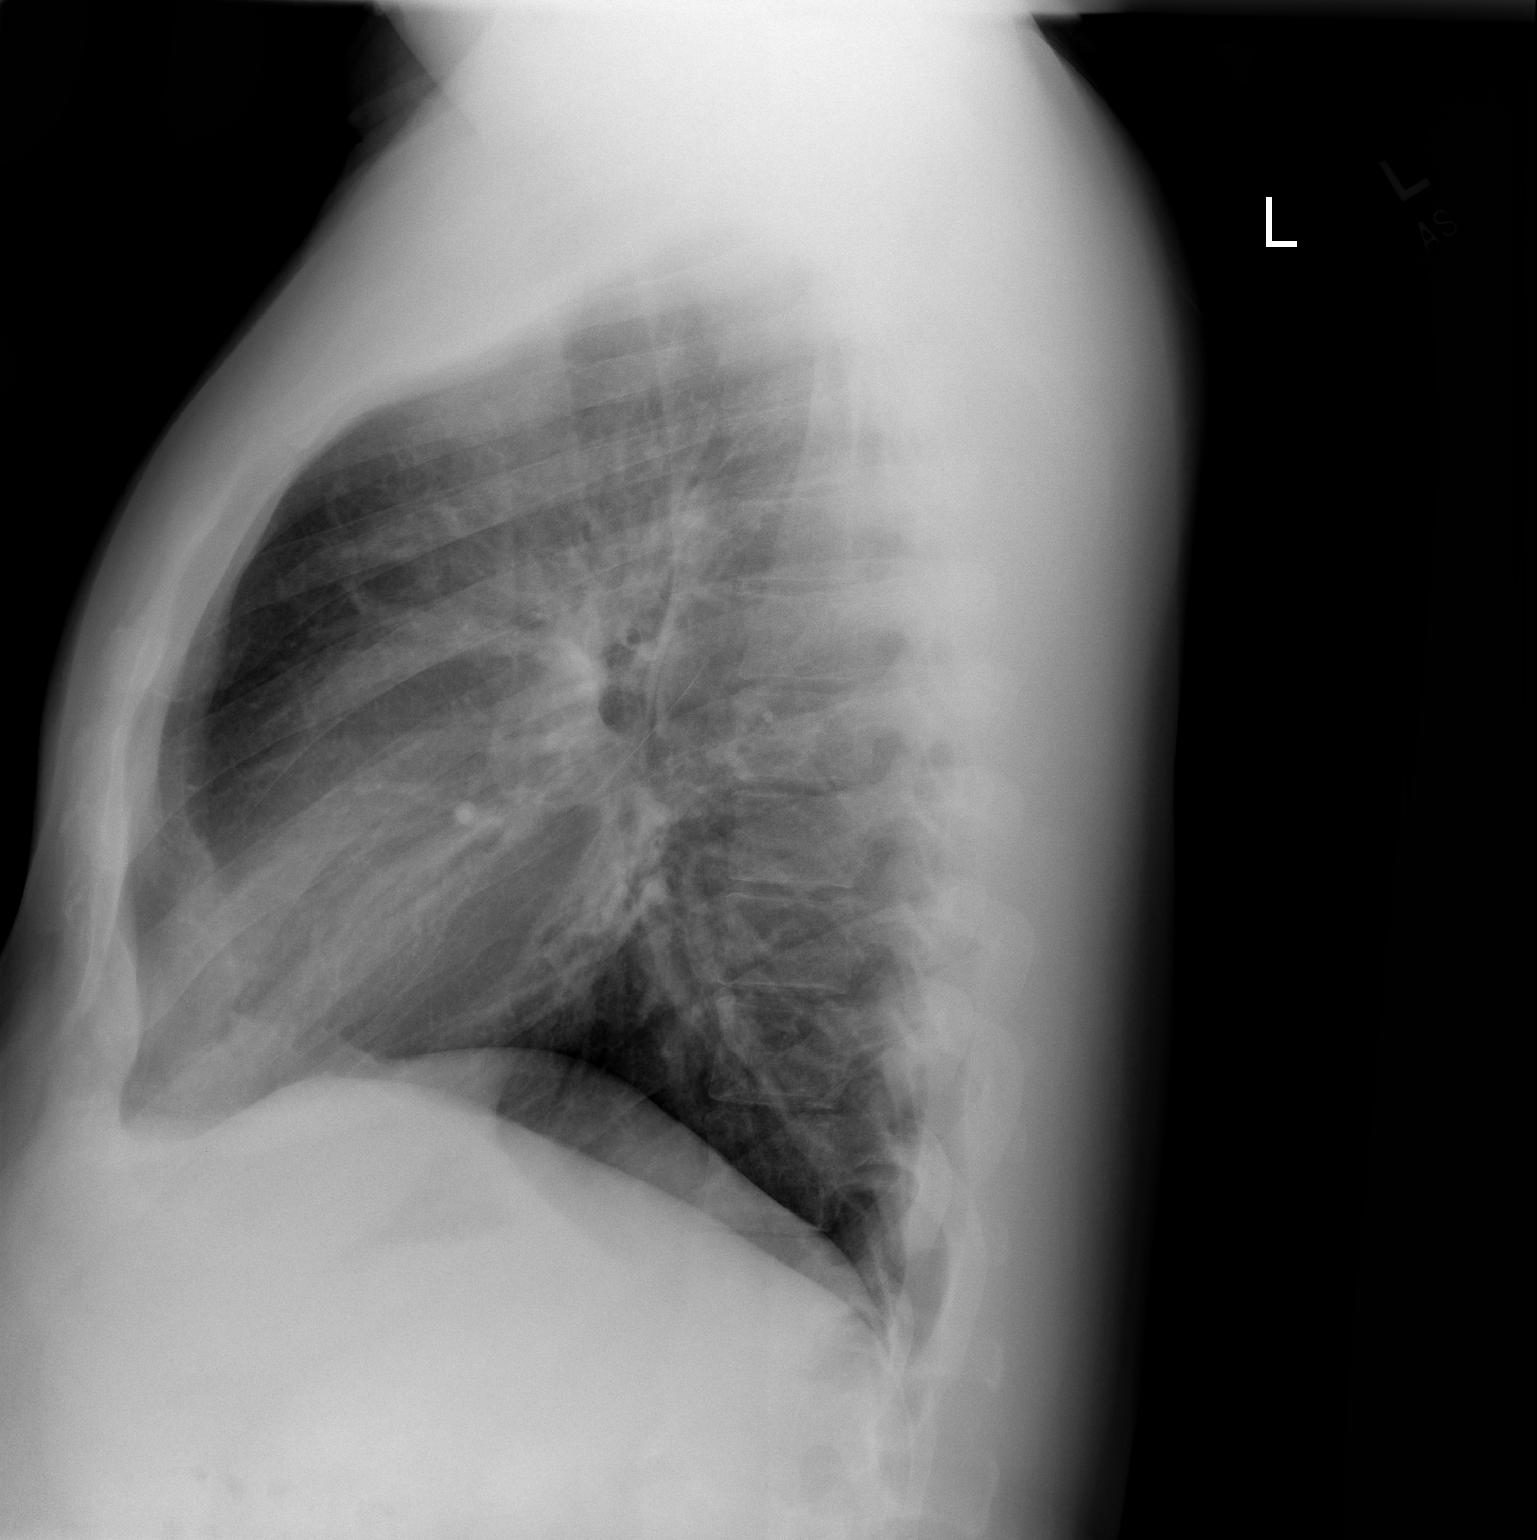

[2 of 2 positions shown; findings below may reference images not displayed]

FINDINGS: Lungs are clear. Heart size and pulmonary vascularity are normal. No
adenopathy. No bone lesions.
IMPRESSION: No edema or consolidation.

## 2020-12-07 ENCOUNTER — Other Ambulatory Visit: Payer: Self-pay

## 2020-12-07 DIAGNOSIS — J4530 Mild persistent asthma, uncomplicated: Secondary | ICD-10-CM

## 2020-12-07 MED ORDER — BUDESONIDE-FORMOTEROL FUMARATE 160-4.5 MCG/ACT IN AERO: 2.0000 | INHALATION_SPRAY | Freq: Two times a day (BID) | RESPIRATORY_TRACT | 2 refills | Status: DC

## 2020-12-07 NOTE — Telephone Encounter (Signed)
RCVD refill for Derek Powell patient, was appropriately seen for physical last November, had future CPE scheduled for this November so protocol passed prior to PCP leaving, Refill pended for review if we can fill until new provider starts

## 2020-12-21 ENCOUNTER — Encounter: Payer: Self-pay | Admitting: Family Medicine

## 2020-12-28 ENCOUNTER — Telehealth: Payer: Self-pay | Admitting: Family Medicine

## 2020-12-28 ENCOUNTER — Other Ambulatory Visit: Payer: Self-pay

## 2020-12-28 ENCOUNTER — Telehealth: Payer: Self-pay

## 2020-12-28 MED ORDER — LOSARTAN POTASSIUM 100 MG PO TABS: 100.0000 mg | ORAL_TABLET | Freq: Every day | ORAL | 1 refills | Status: DC

## 2020-12-28 NOTE — Telephone Encounter (Signed)
Pt called and is requesting a refill on his losartan please send to [id number] [place], [place] - [address]

## 2020-12-28 NOTE — Telephone Encounter (Signed)
Done

## 2020-12-28 NOTE — Telephone Encounter (Signed)
Pt. Called back scheduled med check with Vincenza Hews 02/08/21 when he is back in town. He wanted to know if he could get his Losartan filled until then. He knows he missed his last few apts. With Vickie but some of those he missed were because Vickie was not here and we had to cancel and move his apt. Several times.

## 2020-12-28 NOTE — Telephone Encounter (Signed)
Done KH 

## 2020-12-28 NOTE — Telephone Encounter (Signed)
Lvm for pt to call and schedule a med check appointment before med can be filled. KH

## 2021-01-19 ENCOUNTER — Other Ambulatory Visit: Payer: Self-pay | Admitting: Family Medicine

## 2021-02-09 ENCOUNTER — Other Ambulatory Visit: Payer: Self-pay

## 2021-02-09 ENCOUNTER — Ambulatory Visit: Payer: BC Managed Care – PPO | Admitting: Medical

## 2021-02-09 ENCOUNTER — Encounter: Payer: Self-pay | Admitting: Medical

## 2021-02-09 VITALS — BP 138/88 | HR 98 | Wt 271.0 lb

## 2021-02-09 DIAGNOSIS — I10 Essential (primary) hypertension: Secondary | ICD-10-CM | POA: Diagnosis not present

## 2021-02-09 DIAGNOSIS — E213 Hyperparathyroidism, unspecified: Secondary | ICD-10-CM

## 2021-02-09 DIAGNOSIS — J4530 Mild persistent asthma, uncomplicated: Secondary | ICD-10-CM | POA: Diagnosis not present

## 2021-02-09 DIAGNOSIS — E559 Vitamin D deficiency, unspecified: Secondary | ICD-10-CM

## 2021-02-09 DIAGNOSIS — N529 Male erectile dysfunction, unspecified: Secondary | ICD-10-CM

## 2021-02-09 MED ORDER — ALBUTEROL SULFATE HFA 108 (90 BASE) MCG/ACT IN AERS: 2.0000 | INHALATION_SPRAY | Freq: Four times a day (QID) | RESPIRATORY_TRACT | 0 refills | Status: DC | PRN

## 2021-02-09 MED ORDER — BUDESONIDE-FORMOTEROL FUMARATE 160-4.5 MCG/ACT IN AERO: 2.0000 | INHALATION_SPRAY | Freq: Two times a day (BID) | RESPIRATORY_TRACT | 2 refills | Status: AC

## 2021-02-09 MED ORDER — LOSARTAN POTASSIUM 100 MG PO TABS: 100.0000 mg | ORAL_TABLET | Freq: Every day | ORAL | 1 refills | Status: AC

## 2021-02-09 NOTE — Progress Notes (Signed)
Subjective:  Derek Powell is a 31 y.o. male who presents for Chief Complaint  Patient presents with   Hypertension   Asthma     Here for med check, needs refills.  Was seeing Harland Dingwall, NP here prior  Got laid off last week unfortunately.  He would like to do a COVID and flu vaccine but does not want to do it today since he has job interviews over the next few days and does not want to feel sicker under the weather.  He will reschedule for this  hypertension - hasn't been checking BPs at home lately.   Is compliant with Losartan 100mg  daily.  No chest pain, no palpation, no edema.    He is starting to have some issues with erections in the past few weeks.  It has been a mild issue but new.  He was curious if it was related to the blood pressure medication.  He has been under stress with the holidays and then getting laid off of work.  He works in Architect.  Denies chest pain, palpitations, edema, dyspnea on exertion.  Asthma - uses Symbicort twice daily.  This works well for him.  He needs refills on albuterol and Symbicort.  No recent problems with asthma.  Smokes Cigars  twice monthly and smokes marijuana  He sees psychiatry for his stimulant medication.  Sees. Dr. Elmyra Ricks.  No other new complaint  Past Medical History:  Diagnosis Date   Asthma    Elevated liver enzymes    HTN (hypertension)    Hypercalcemia 04/25/2018   Current Outpatient Medications on File Prior to Visit  Medication Sig Dispense Refill   amphetamine-dextroamphetamine (ADDERALL) 20 MG tablet Take 10-20 mg by mouth daily.     vitamin C (ASCORBIC ACID) 500 MG tablet Take 500 mg by mouth daily.     VYVANSE 50 MG capsule Take 50 mg by mouth every morning.     Cholecalciferol (VITAMIN D3) 25 MCG (1000 UT) CAPS Take 2 capsules by mouth daily. (Patient not taking: Reported on 02/09/2021)     No current facility-administered medications on file prior to visit.     The following portions  of the patient's history were reviewed and updated as appropriate: allergies, current medications, past family history, past medical history, past social history, past surgical history and problem list.  ROS Otherwise as in subjective above    Objective: BP 138/88 (BP Location: Right Arm, Patient Position: Sitting)    Pulse 98    Wt 271 lb (122.9 kg)    SpO2 98%    BMI 36.25 kg/m   BP Readings from Last 3 Encounters:  02/09/21 138/88  12/20/19 120/84  12/17/19 140/90    General appearance: alert, no distress, well developed, well nourished Neck: supple, no lymphadenopathy, no thyromegaly, no masses Heart: RRR, normal S1, S2, no murmurs Lungs: CTA bilaterally, no wheezes, rhonchi, or rales Pulses: 2+ radial pulses, 2+ pedal pulses, normal cap refill Ext: no edema GU: declined    Assessment: Encounter Diagnoses  Name Primary?   Mild persistent asthma without complication    Primary hypertension Yes   Hyperparathyroidism ([place])    Hypercalcemia    Vitamin D deficiency    Erectile dysfunction, unspecified erectile dysfunction type      Plan: Asthma-doing fine on current therapy.  Continue Symbicort for prevention.  Continue albuterol as needed.  We discussed doing a PFT next visit.  Advise he would need to be off inhalers 48 hours prior  to visit  Hypertension-continue current medication  Erectile dysfunction-we discussed possible causes.  I suspect that his even somewhat stress related given his recent stressors.  Possibly vasculogenic as well.  Advise EKG updated next visit.  He declines EKG today.  I advised I doubt is related to his current blood pressure medicine since he has tolerated this for over a year now without issue  Looking back through his lab work earlier in the year in 2022 he did have elevated calcium however repeat calcium and PTH were normal  Vitamin D deficiency-advise he begin back on over-the-counter vitamin D 1000 units daily   Mox was seen  today for hypertension and asthma.  Diagnoses and all orders for this visit:  Primary hypertension  Mild persistent asthma without complication -     budesonide-formoterol (SYMBICORT) 160-4.5 MCG/ACT inhaler; Inhale 2 puffs into the lungs 2 (two) times daily. -     albuterol (VENTOLIN HFA) 108 (90 Base) MCG/ACT inhaler; Inhale 2 puffs into the lungs every 6 (six) hours as needed for wheezing or shortness of breath.  Hyperparathyroidism ([place])  Hypercalcemia  Vitamin D deficiency  Erectile dysfunction, unspecified erectile dysfunction type  Other orders -     losartan (COZAAR) 100 MG tablet; Take 1 tablet (100 mg total) by mouth daily.    Follow up: 63mo for physical fasting, EKG

## 2021-03-14 ENCOUNTER — Other Ambulatory Visit: Payer: Self-pay | Admitting: Medical

## 2021-03-14 DIAGNOSIS — J4530 Mild persistent asthma, uncomplicated: Secondary | ICD-10-CM

## 2021-07-30 ENCOUNTER — Encounter: Payer: Self-pay | Admitting: Internal Medicine

## 2021-10-06 ENCOUNTER — Encounter: Payer: Self-pay | Admitting: Internal Medicine

## 2021-11-09 ENCOUNTER — Encounter: Payer: Self-pay | Admitting: Internal Medicine

## 2021-12-14 ENCOUNTER — Encounter: Payer: Self-pay | Admitting: Internal Medicine

## 2022-05-05 IMAGING — DX DG CHEST 2V
2 series · 2 of 2 positions shown · non-contrast
Comparison: 6565

CLINICAL DATA: Hypercalcemia

EXAM:
CHEST - 2 VIEW

[dg chest 2 view (1 of 2)]
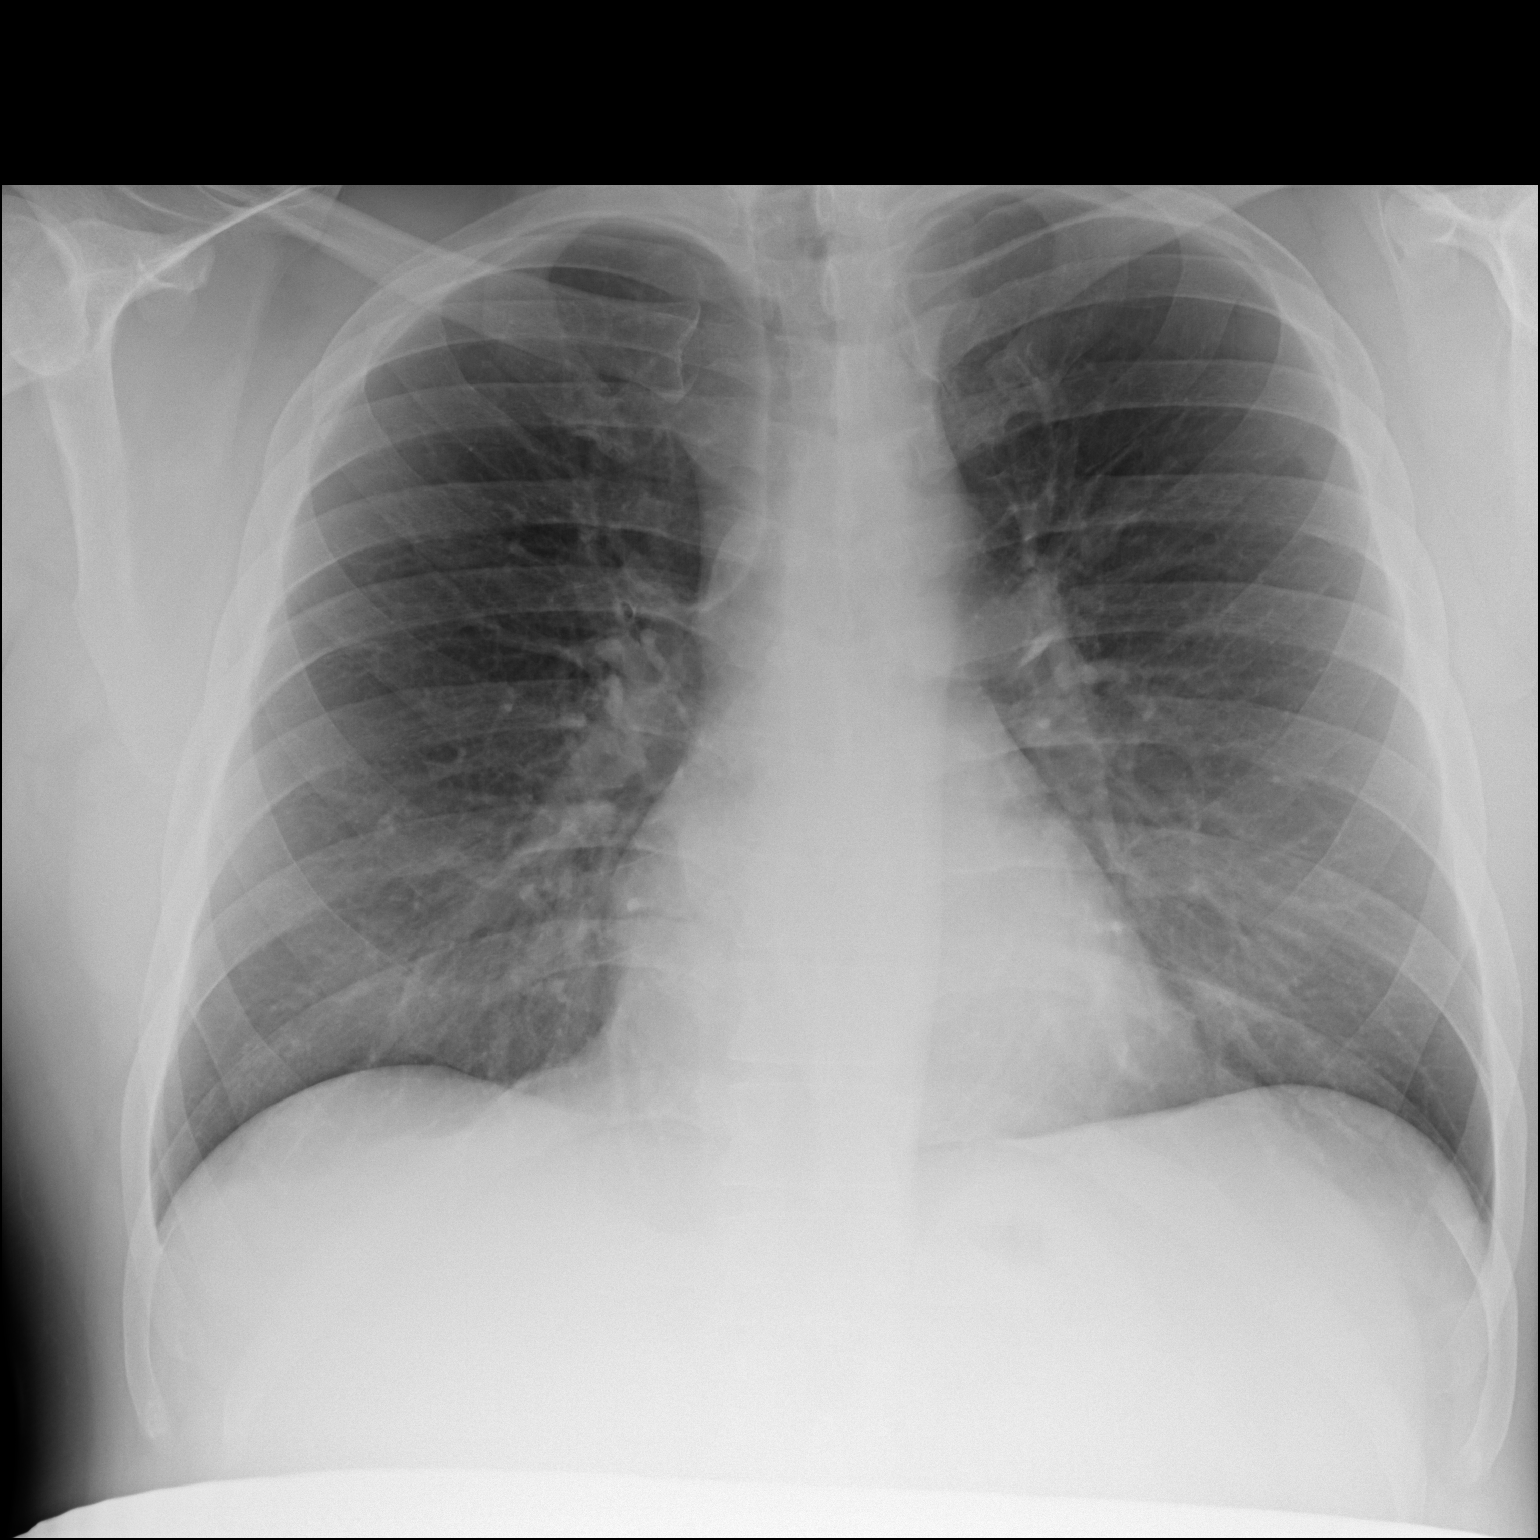

[dg chest 2 view (2 of 2)]
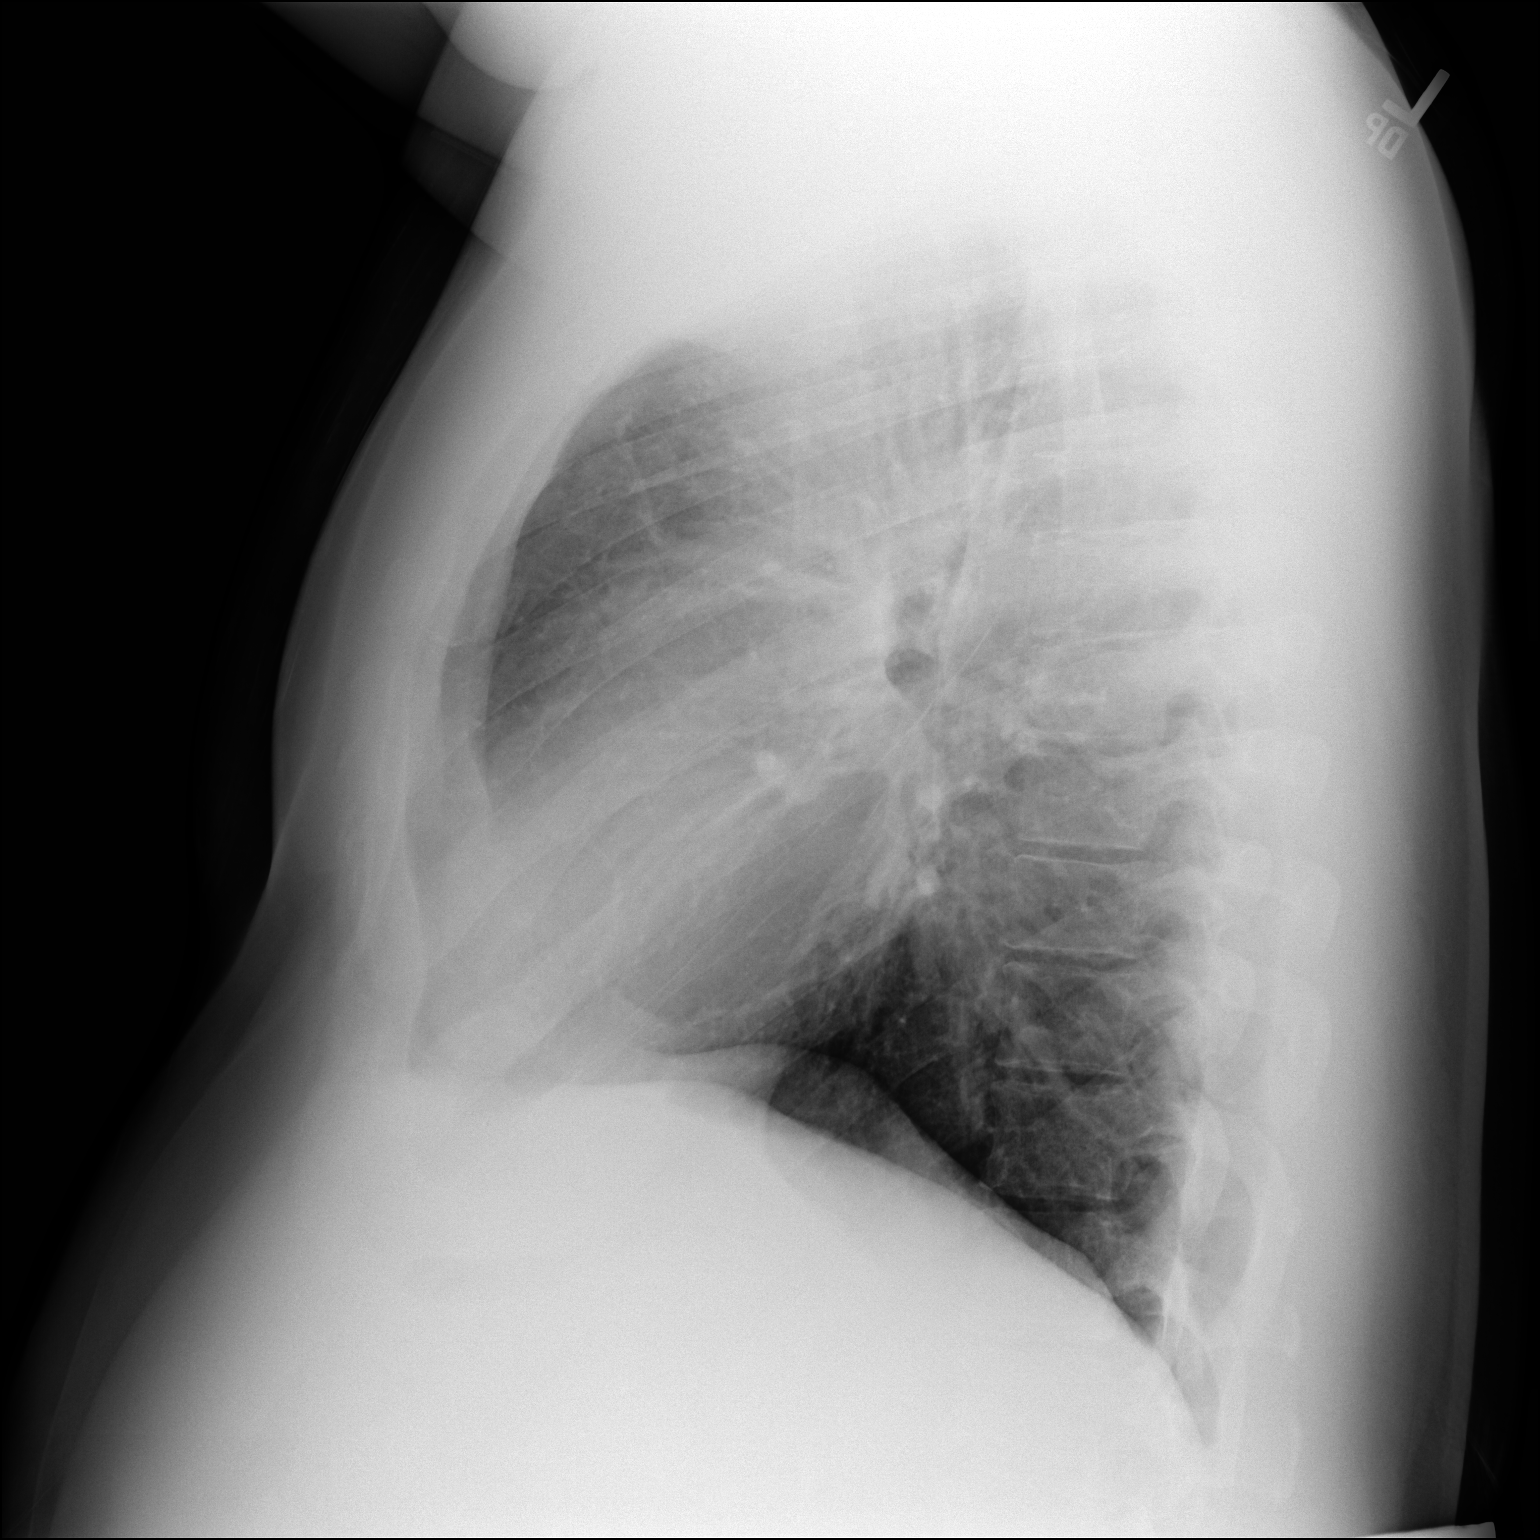

[2 of 2 positions shown; findings below may reference images not displayed]

FINDINGS: The heart size and mediastinal contours are within normal limits.
Both lungs are clear. No pleural effusion. The visualized skeletal
structures are unremarkable.
IMPRESSION: No acute process in the chest.

## 2022-05-05 IMAGING — US US ABDOMEN LIMITED
1 series · 14 of 25 positions shown · non-contrast
Comparison: None.

CLINICAL DATA: Elevated liver enzymes.

EXAM:
ULTRASOUND ABDOMEN LIMITED RIGHT UPPER QUADRANT

[Series 1: us abdomen limited · 0.23mm/px · 14 of 42 slices shown]
[im 1/42]
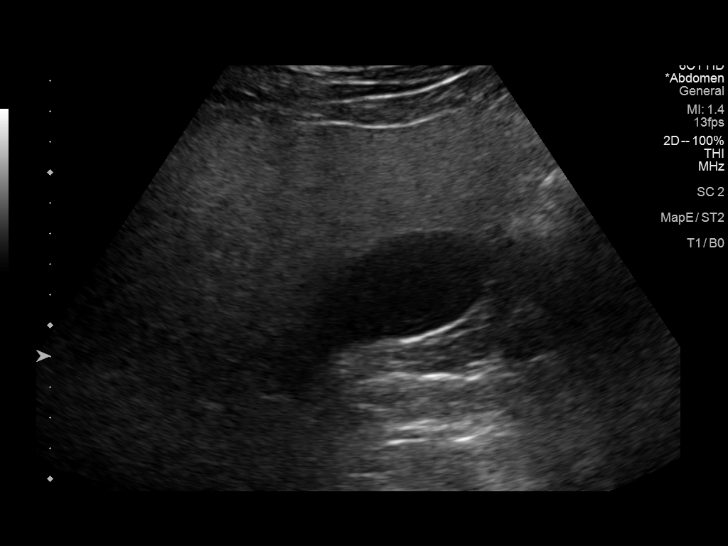
[im 4/42]
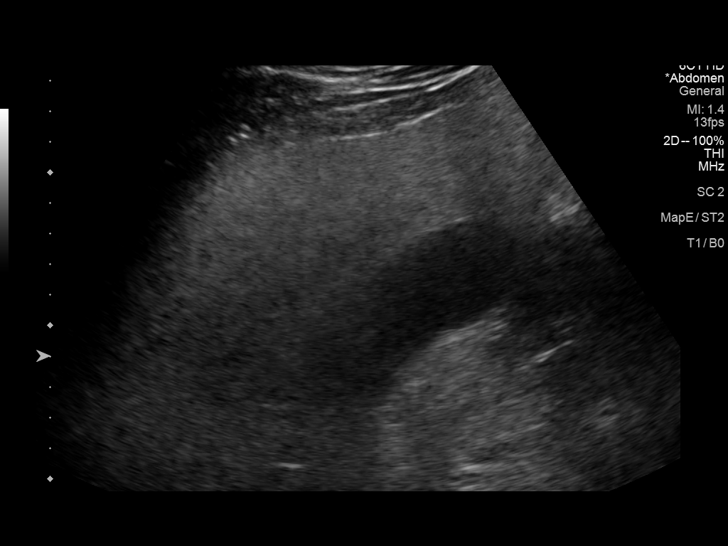
[im 7/42]
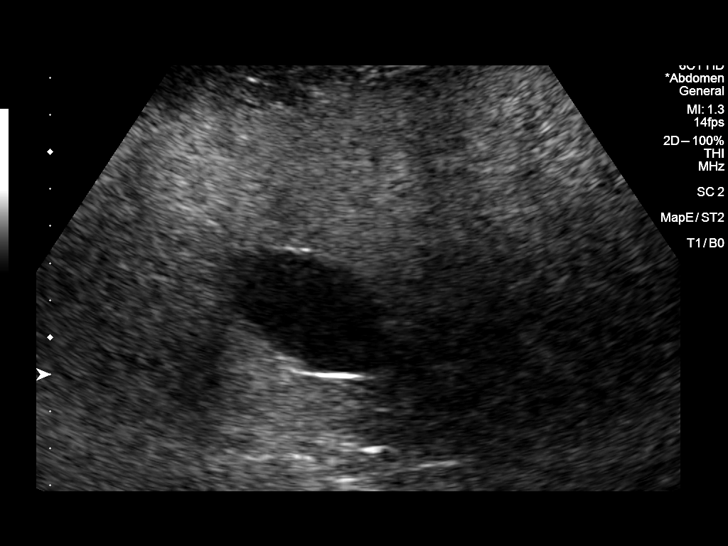
[im 11/42]
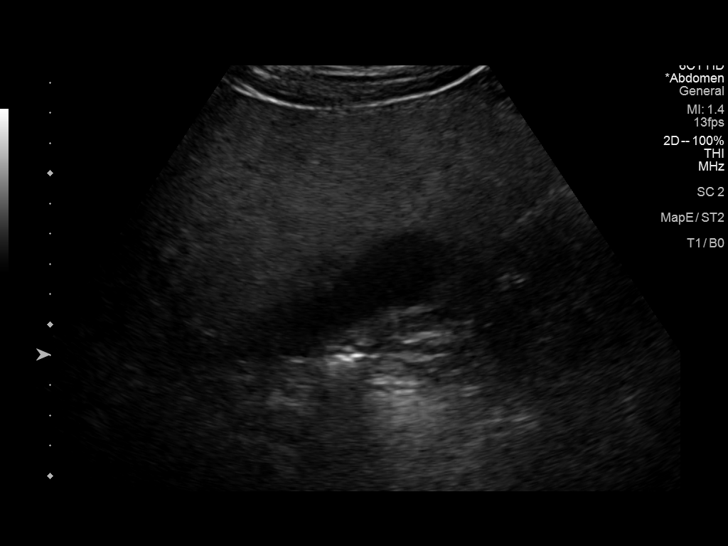
[im 14/42]
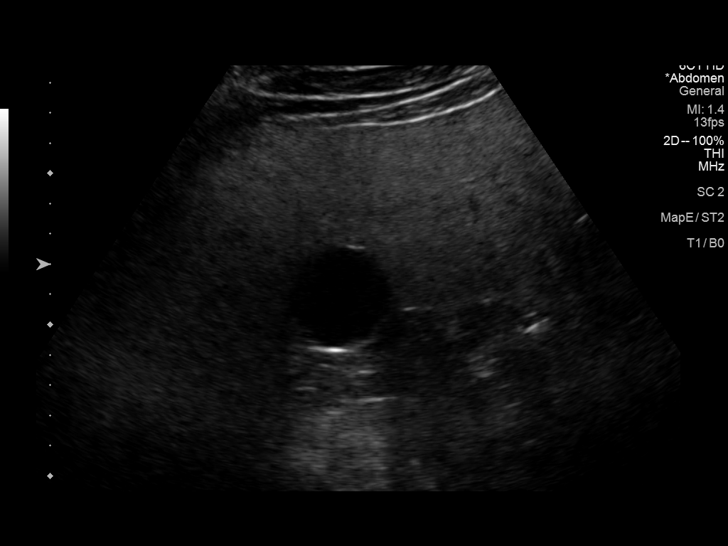
[im 16/42]
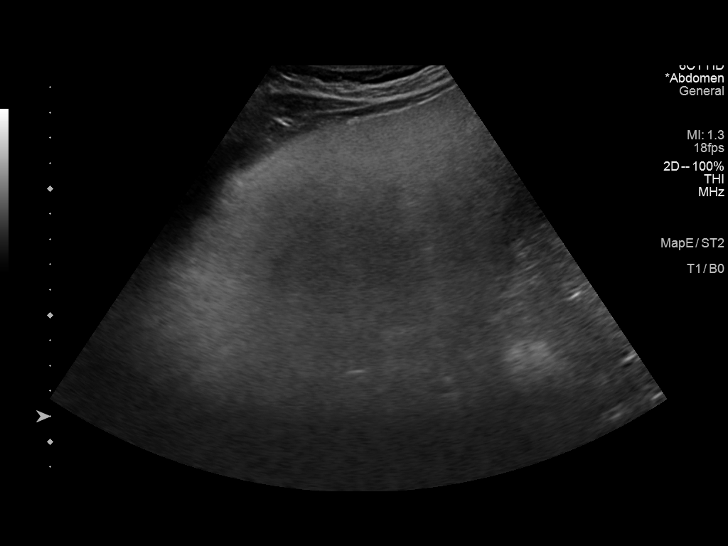
[im 19/42]
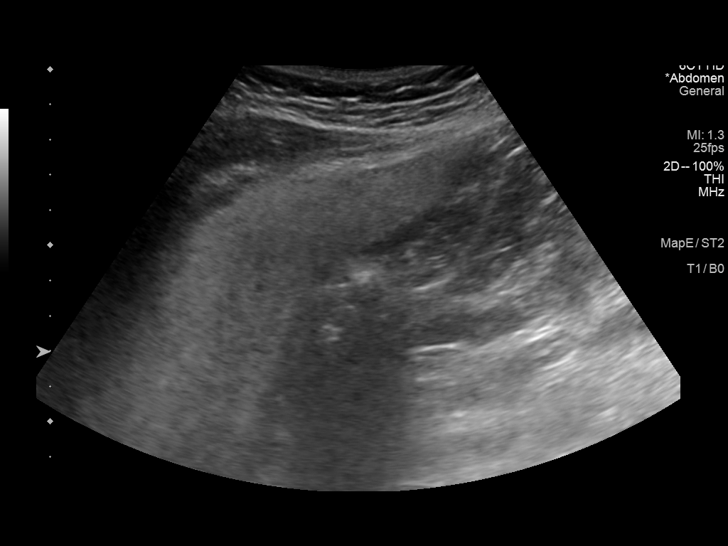
[im 23/42]
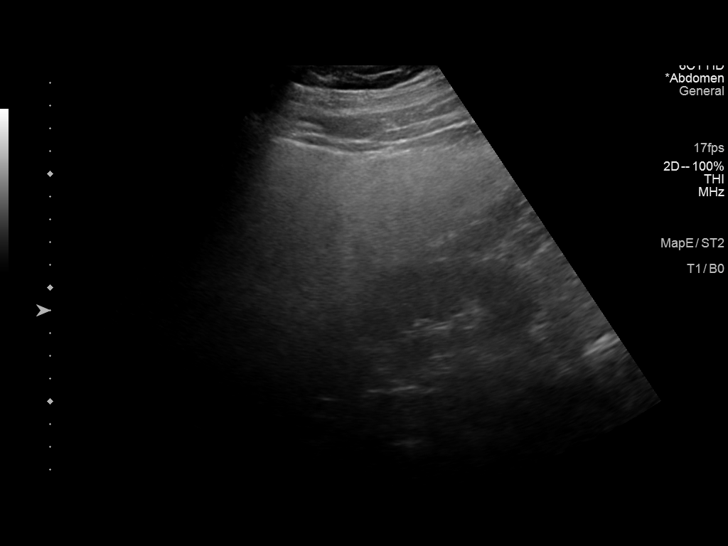
[im 26/42]
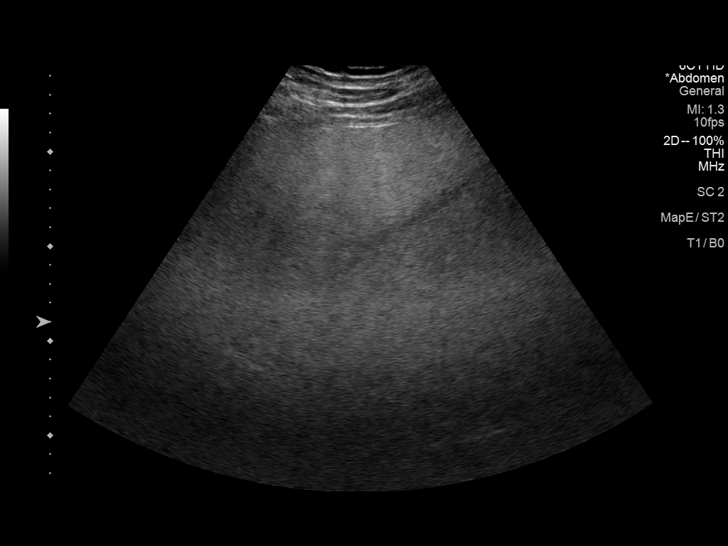
[im 28/42]
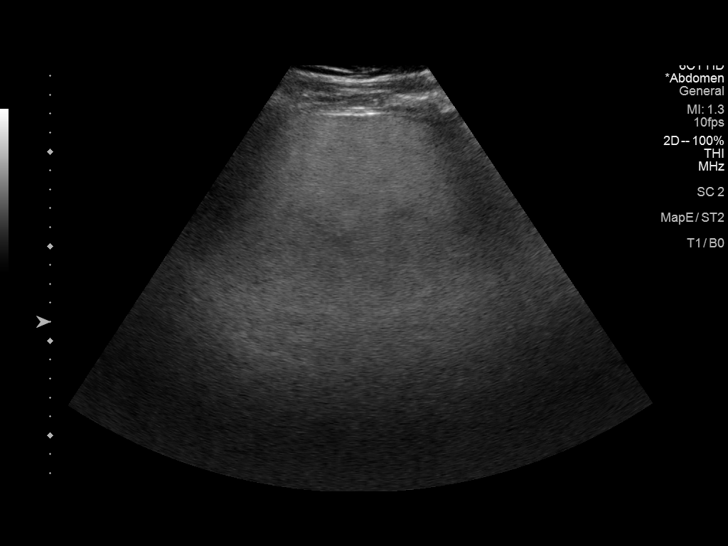
[im 31/42]
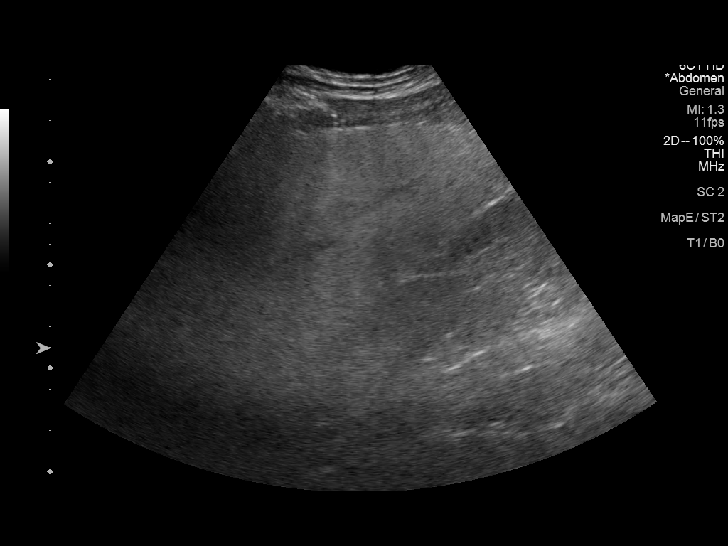
[im 35/42]
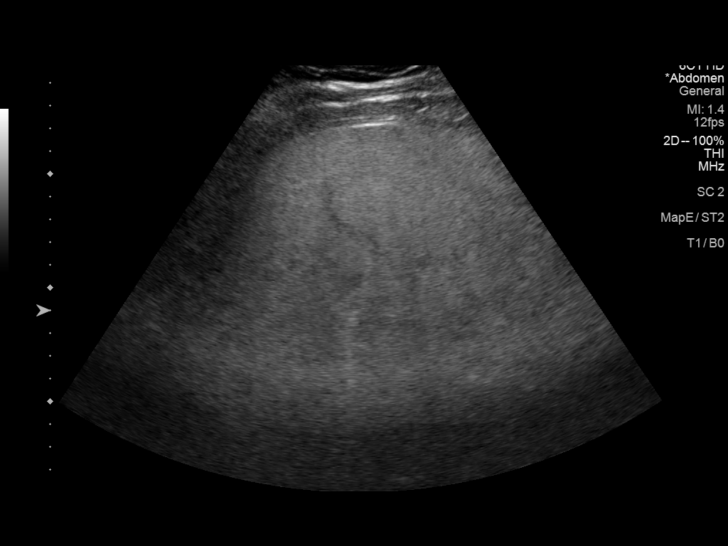
[im 38/42]
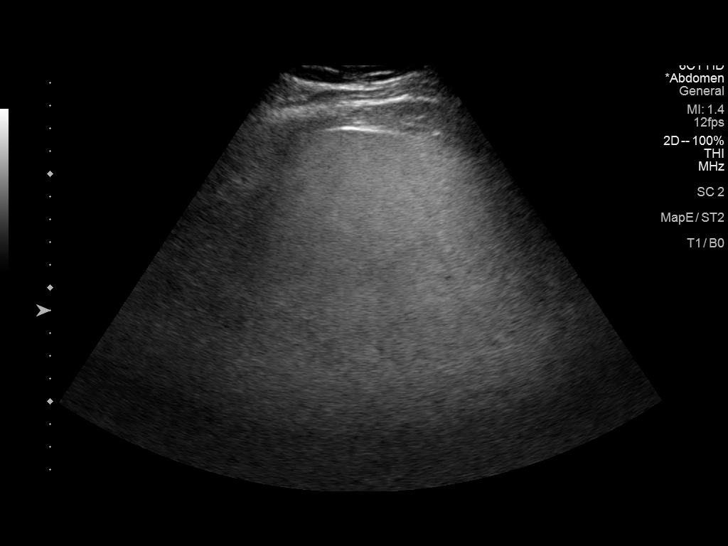
[im 42/42]
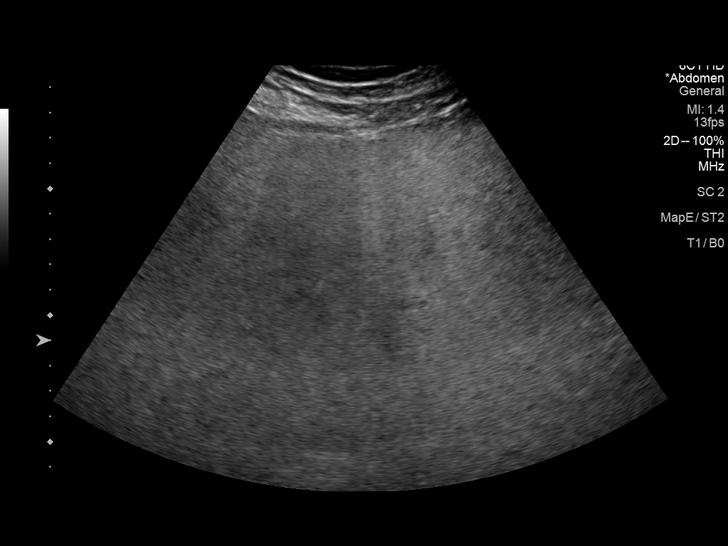

[14 of 25 positions shown; findings below may reference images not displayed]

FINDINGS: Gallbladder:

No gallstones or wall thickening visualized. No sonographic Murphy
sign noted by sonographer.

Common bile duct:

Diameter: 4.7 mm, within normal limits.

Liver:

No focal lesion identified. Diffusely increased parenchymal
echogenicity. Portal vein is patent on color Doppler imaging with
normal direction of blood flow towards the liver.

Other: None.
IMPRESSION: Hepatic steatosis.  No focal hepatic lesions.
